# Patient Record
Sex: Male | Born: 1980 | Race: White | Hispanic: No | Marital: Married | Smoking: Former smoker
Health system: Southern US, Community
[De-identification: ages and names within clinical notes are randomized; demographics above are authoritative.]

## PROBLEM LIST (undated history)

## (undated) DIAGNOSIS — F419 Anxiety disorder, unspecified: Secondary | ICD-10-CM

## (undated) DIAGNOSIS — J45909 Unspecified asthma, uncomplicated: Secondary | ICD-10-CM

## (undated) HISTORY — DX: Anxiety disorder, unspecified: F41.9

## (undated) HISTORY — DX: Unspecified asthma, uncomplicated: J45.909

## (undated) HISTORY — PX: FOOT SURGERY: SHX648

---

## 2014-06-11 ENCOUNTER — Ambulatory Visit: Payer: Self-pay | Admitting: Family

## 2016-12-01 ENCOUNTER — Encounter (INDEPENDENT_AMBULATORY_CARE_PROVIDER_SITE_OTHER): Payer: Self-pay

## 2016-12-01 ENCOUNTER — Ambulatory Visit (INDEPENDENT_AMBULATORY_CARE_PROVIDER_SITE_OTHER): Payer: BLUE CROSS/BLUE SHIELD | Admitting: Family Medicine

## 2016-12-01 ENCOUNTER — Encounter: Payer: Self-pay | Admitting: Family Medicine

## 2016-12-01 VITALS — BP 114/66 | HR 66 | Temp 97.2°F | Ht 74.0 in | Wt 202.0 lb

## 2016-12-01 DIAGNOSIS — R3129 Other microscopic hematuria: Secondary | ICD-10-CM | POA: Diagnosis not present

## 2016-12-01 LAB — URINALYSIS, COMPLETE
Bilirubin, UA: NEGATIVE
Glucose, UA: NEGATIVE
KETONES UA: NEGATIVE
LEUKOCYTES UA: NEGATIVE
NITRITE UA: NEGATIVE
PH UA: 7 (ref 5.0–7.5)
Specific Gravity, UA: 1.025 (ref 1.005–1.030)
Urobilinogen, Ur: 0.2 mg/dL (ref 0.2–1.0)

## 2016-12-01 LAB — MICROSCOPIC EXAMINATION
Bacteria, UA: NONE SEEN
RENAL EPITHEL UA: NONE SEEN /HPF

## 2016-12-01 NOTE — Progress Notes (Signed)
   HPI  Patient presents today with recently found microscopic hematuria.  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 He denies any issues or symptoms of kidney stone or other concerns.  He has a history of vasectomy with some mild complications, described as dribbling after he urinates. He states he was worked up with a cystoscopy after that.  Is a concerning family history of renal cancer in his father. His mother recently passed away with pancreatic cancer.  PMH: Smoking status noted Medical history significant for asthma No previous surgeries except for vasectomy and cystoscope Family history positive for kidney cancer in father, pancreatic cancer in mother Denies alcohol and drug use ROS: Per HPI  Objective: BP 114/66   Pulse 66   Temp 97.2 F (36.2 C) (Oral)   Ht '6\' 2"'$  (1.88 m)   Wt 202 lb (91.6 kg)   BMI 25.94 kg/m  Gen: NAD, alert, cooperative with exam HEENT: NCAT CV: RRR, good S1/S2, no murmur Resp: CTABL, no wheezes, non-labored Abd: SNTND, BS present, no guarding or organomegaly Ext: No edema, warm Neuro: Alert and oriented, drink 5/5 and sensation intact in all 4 extremities, 2+ patellar tendon reflexes  Assessment and plan:  # microscopic hematuria Discussed at [place] Father with Hx of renal Cancer Urine protein/cre ration, culture Refer to urology [place] Hx of vasectomy with complication requiring cystoscopy about 5 years ago.    Orders Placed This Encounter  Procedures  . Urine culture  . Microscopic Examination  . Urinalysis, Complete  . BMP8+EGFR  . CBC with Differential/Platelet  . Protein / Creatinine Ratio, Urine  . Ambulatory referral to Urology    Referral Priority:   Routine    Referral Type:   Consultation    Referral Reason:   Specialty Services Required    Requested Specialty:   Urology    Number of Visits Requested:   [place], MD [place] 12/01/2016, 5:08 PM

## 2016-12-01 NOTE — Patient Instructions (Signed)
Great to meet you!  We will call with labs within 1 week  You will hear from us about an appointment with urology

## 2016-12-02 LAB — CBC WITH DIFFERENTIAL/PLATELET
BASOS ABS: 0.1 10*3/uL (ref 0.0–0.2)
BASOS: 1 %
EOS (ABSOLUTE): 0.3 10*3/uL (ref 0.0–0.4)
Eos: 4 %
Hematocrit: 41.9 % (ref 37.5–51.0)
Hemoglobin: 14.8 g/dL (ref 13.0–17.7)
IMMATURE GRANS (ABS): 0 10*3/uL (ref 0.0–0.1)
IMMATURE GRANULOCYTES: 0 %
LYMPHS: 28 %
Lymphocytes Absolute: 1.7 10*3/uL (ref 0.7–3.1)
MCH: 32.5 pg (ref 26.6–33.0)
MCHC: 35.3 g/dL (ref 31.5–35.7)
MCV: 92 fL (ref 79–97)
MONOS ABS: 0.5 10*3/uL (ref 0.1–0.9)
Monocytes: 8 %
NEUTROS PCT: 59 %
Neutrophils Absolute: 3.7 10*3/uL (ref 1.4–7.0)
PLATELETS: 277 10*3/uL (ref 150–379)
RBC: 4.55 x10E6/uL (ref 4.14–5.80)
RDW: 13.6 % (ref 12.3–15.4)
WBC: 6.3 10*3/uL (ref 3.4–10.8)

## 2016-12-02 LAB — BMP8+EGFR
BUN/Creatinine Ratio: 19 (ref 9–20)
BUN: 13 mg/dL (ref 6–20)
CHLORIDE: 101 mmol/L (ref 96–106)
CO2: 27 mmol/L (ref 18–29)
Calcium: 9.2 mg/dL (ref 8.7–10.2)
Creatinine, Ser: 0.68 mg/dL — ABNORMAL LOW (ref 0.76–1.27)
GFR calc Af Amer: 141 mL/min/{1.73_m2} (ref >59)
GFR, EST NON AFRICAN AMERICAN: 122 mL/min/{1.73_m2} (ref >59)
GLUCOSE: 94 mg/dL (ref 65–99)
POTASSIUM: 4 mmol/L (ref 3.5–5.2)
SODIUM: 141 mmol/L (ref 134–144)

## 2016-12-02 LAB — URINE CULTURE: Organism ID, Bacteria: NO GROWTH

## 2016-12-02 LAB — PROTEIN / CREATININE RATIO, URINE
CREATININE, UR: 152.2 mg/dL
PROTEIN UR: 90.6 mg/dL
PROTEIN/CREAT RATIO: 595 mg/g{creat} — AB (ref 0–200)

## 2016-12-06 ENCOUNTER — Telehealth: Payer: Self-pay | Admitting: Family Medicine

## 2016-12-06 DIAGNOSIS — R3129 Other microscopic hematuria: Secondary | ICD-10-CM | POA: Insufficient documentation

## 2016-12-06 DIAGNOSIS — R809 Proteinuria, unspecified: Secondary | ICD-10-CM

## 2016-12-06 NOTE — Telephone Encounter (Signed)
Depression with microscopic hematuria and significant proteinuria. I called and discussed with him, we will change here slightly and evaluate with a CT abdomen/pelvis and refer him to nephrology instead of urology.  Murtis SinkSam Bradshaw, MD [facility] 12/06/2016, 5:27 PM

## 2016-12-07 ENCOUNTER — Ambulatory Visit: Payer: Self-pay | Admitting: Family Medicine

## 2016-12-13 ENCOUNTER — Telehealth: Payer: Self-pay | Admitting: Family Medicine

## 2016-12-13 NOTE — Telephone Encounter (Signed)
Pt aware of recommendation °

## 2016-12-13 NOTE — Telephone Encounter (Signed)
Pt found a lump last evening on his right testicle about marble size. Will the test on Friday cover finding seeing this? Or should he see you before this test on Friday

## 2016-12-13 NOTE — Telephone Encounter (Signed)
This should be a reasonable study for that, It is primarily for work up of hematuria. If there is nothing seen and it is peristent then I would recommend scrotal US.   Murtis SinkSam Kate Larock, MD [facility] 12/13/2016, 3:00 PM

## 2016-12-16 ENCOUNTER — Telehealth: Payer: Self-pay | Admitting: Family Medicine

## 2016-12-16 DIAGNOSIS — R809 Proteinuria, unspecified: Secondary | ICD-10-CM

## 2016-12-16 DIAGNOSIS — R3129 Other microscopic hematuria: Secondary | ICD-10-CM

## 2016-12-16 NOTE — Telephone Encounter (Signed)
Per pt, out of pocket for scan is $1700 Are there any other options Please advise

## 2016-12-16 NOTE — Telephone Encounter (Signed)
Yes, I would gladly do an ultrasound and wait on the referral to see if they feel the C Tscan is necessary.   I would recommend not paying 1700 for the scan I would cancel scan and discuss with specialist.   Murtis SinkSam Ripken Rekowski, MD [facility] 12/16/2016, 12:31 PM

## 2016-12-17 ENCOUNTER — Ambulatory Visit (HOSPITAL_COMMUNITY): Admission: RE | Admit: 2016-12-17 | Payer: BLUE CROSS/BLUE SHIELD | Source: Ambulatory Visit

## 2016-12-17 ENCOUNTER — Telehealth: Payer: Self-pay | Admitting: Family Medicine

## 2016-12-17 ENCOUNTER — Ambulatory Visit (HOSPITAL_COMMUNITY)
Admission: RE | Admit: 2016-12-17 | Discharge: 2016-12-17 | Disposition: A | Discharge status: Home / Self Care | Payer: BLUE CROSS/BLUE SHIELD | Source: Ambulatory Visit | Attending: Family Medicine | Admitting: Family Medicine

## 2016-12-17 DIAGNOSIS — R3129 Other microscopic hematuria: Secondary | ICD-10-CM | POA: Diagnosis not present

## 2016-12-17 DIAGNOSIS — R809 Proteinuria, unspecified: Secondary | ICD-10-CM | POA: Diagnosis present

## 2016-12-17 DIAGNOSIS — N50812 Left testicular pain: Secondary | ICD-10-CM | POA: Insufficient documentation

## 2016-12-17 DIAGNOSIS — N433 Hydrocele, unspecified: Secondary | ICD-10-CM | POA: Insufficient documentation

## 2016-12-17 MED ORDER — IOPAMIDOL (ISOVUE-300) INJECTION 61%
100.0000 mL | Freq: Once | INTRAVENOUS | Status: AC | PRN
  Administered 2016-12-17: 100 mL via INTRAVENOUS

## 2016-12-17 NOTE — Telephone Encounter (Signed)
Called and discussed CAT scan findings.  Patient will watch the testicle lesion for now. He actually noticed a pea-sized lesion on his right testicle, hydrocephalus seen in the left.  Also I discussed the lytic lesion in his pelvis, he will for now watch fully weight. If he develops any pain in that area of the knee he will let me know and we will investigate further.  Brendan SinkSam Bradshaw, MD [facility] 12/17/2016, 2:42 PM

## 2016-12-29 ENCOUNTER — Ambulatory Visit (HOSPITAL_COMMUNITY): Payer: BLUE CROSS/BLUE SHIELD

## 2017-01-14 ENCOUNTER — Ambulatory Visit: Payer: BLUE CROSS/BLUE SHIELD | Admitting: Urology

## 2018-11-25 IMAGING — CT CT ABD-PELV W/ CM
2 of 5 series · 15 of 46 positions shown, 17 images · IV contrast (Isovue)
Comparison: None.

CLINICAL DATA: 35-year-old male with microscopic hematuria and
proteinuria noted incidentally in while undergoing urine drug test.

EXAM:
CT ABDOMEN AND PELVIS WITH CONTRAST
TECHNIQUE: Multidetector CT imaging of the abdomen and pelvis was performed
using the standard protocol following bolus administration of
intravenous contrast.
CONTRAST:  100mL EWEXU0-WOO IOPAMIDOL (EWEXU0-WOO) INJECTION 61%

[Series 2: axial st · axial · 0.68mm/px · z∈[-692,-246]mm · 12 of 101 slices shown, 14 images]
[im 6/101  soft-tissue]
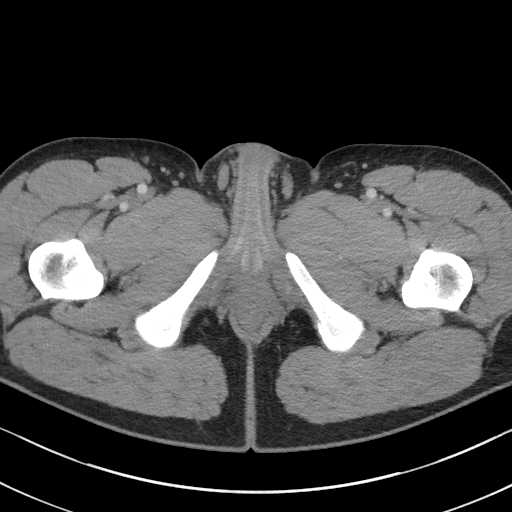
[im 6/101  bone]
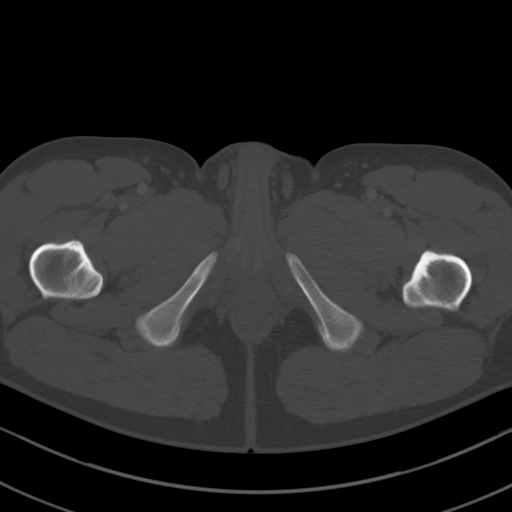
[im 16/101  soft-tissue]
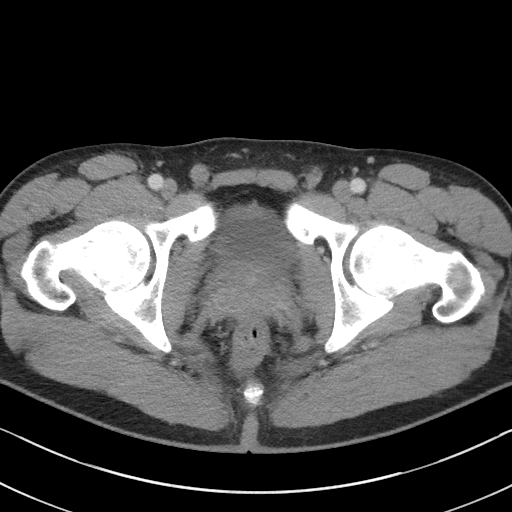
[im 22/101  soft-tissue]
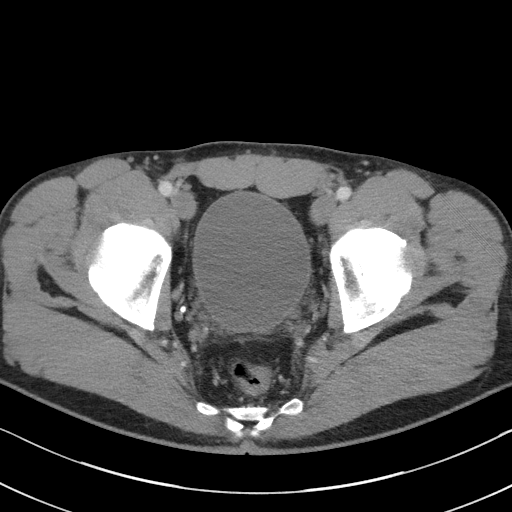
[im 32/101  soft-tissue]
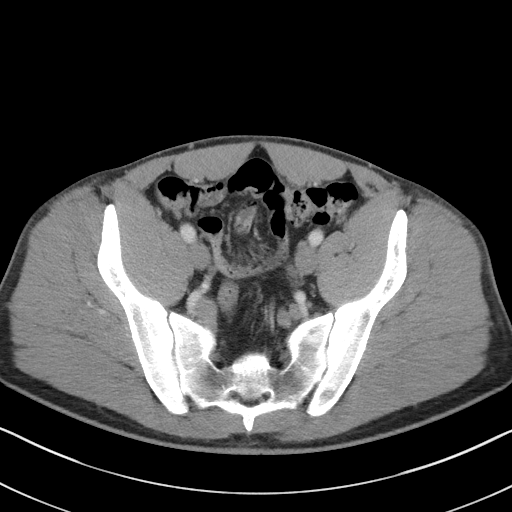
[im 37/101  soft-tissue]
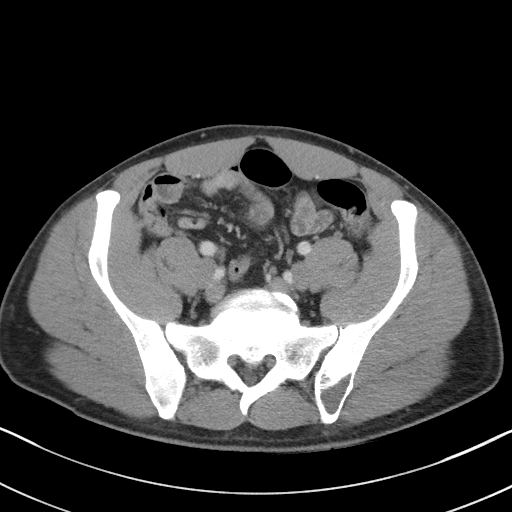
[im 48/101  soft-tissue]
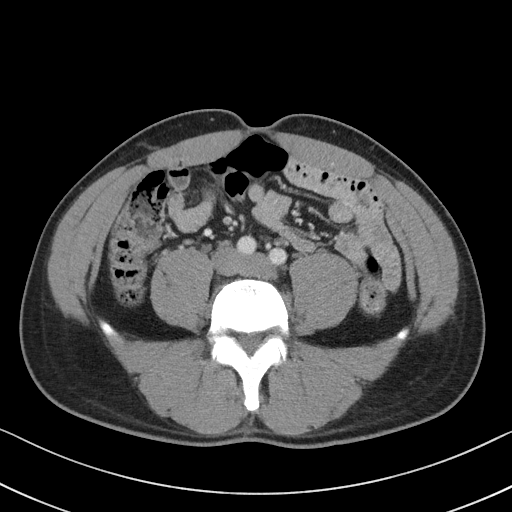
[im 53/101  soft-tissue]
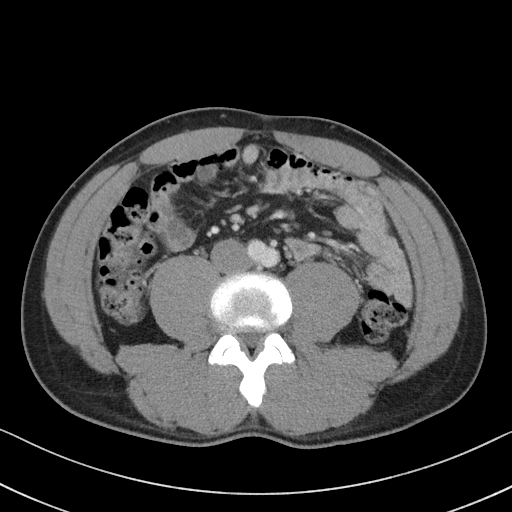
[im 64/101  soft-tissue]
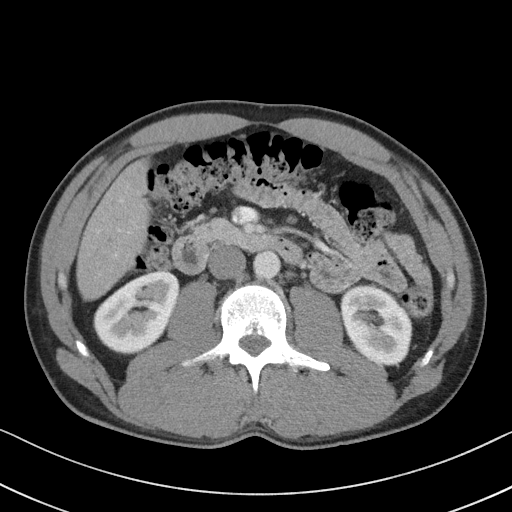
[im 69/101  soft-tissue]
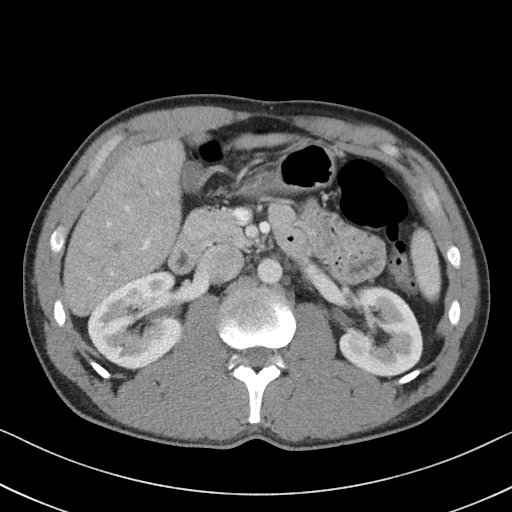
[im 69/101  bone]
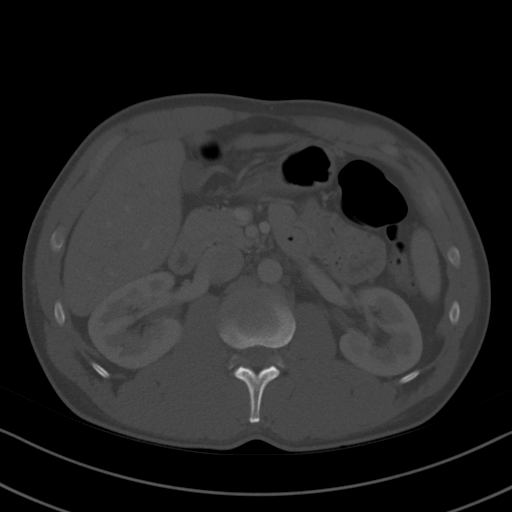
[im 79/101  soft-tissue]
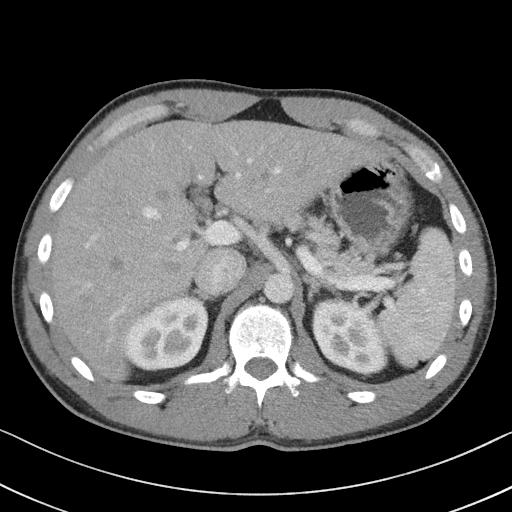
[im 85/101  soft-tissue]
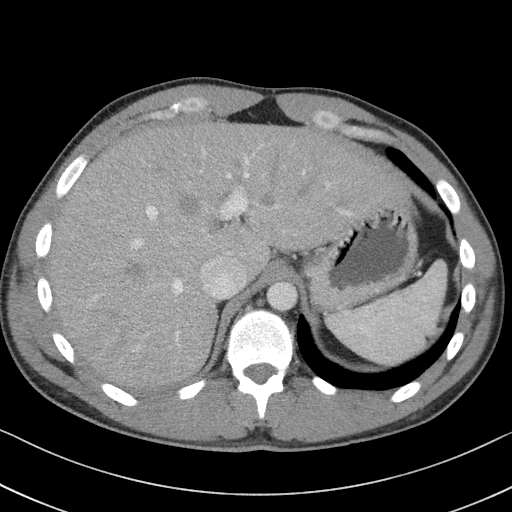
[im 95/101  soft-tissue]
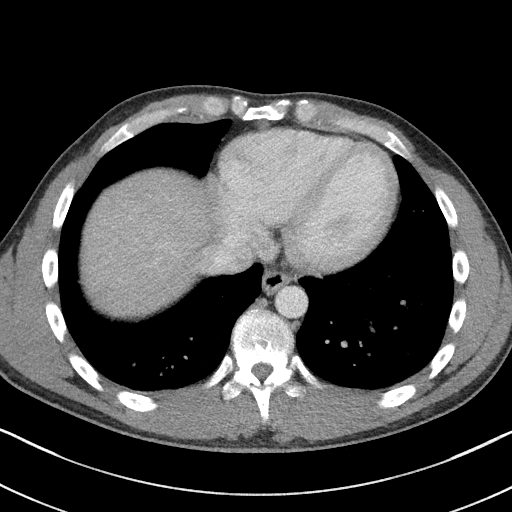

[Series 5: coronal st · coronal · 0.86mm/px · 3 of 101 slices shown]
[im 34/101  soft-tissue]
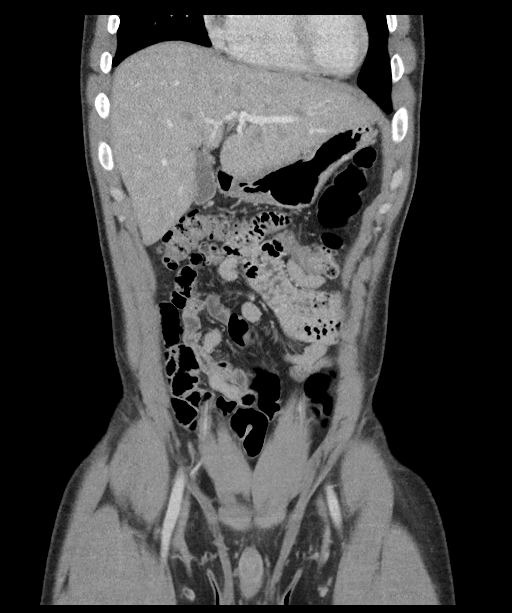
[im 45/101  soft-tissue]
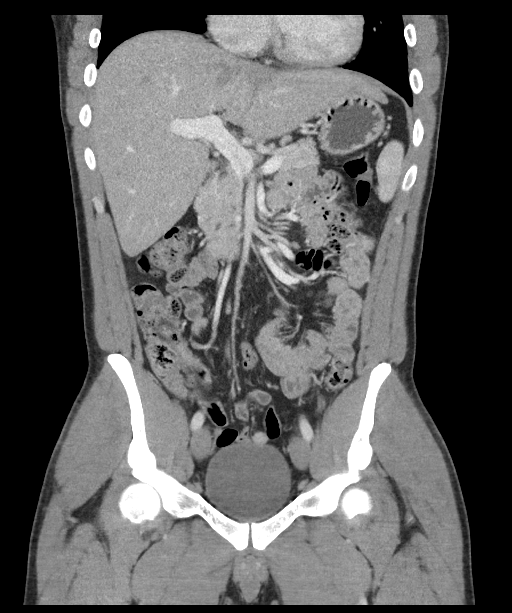
[im 56/101  soft-tissue]
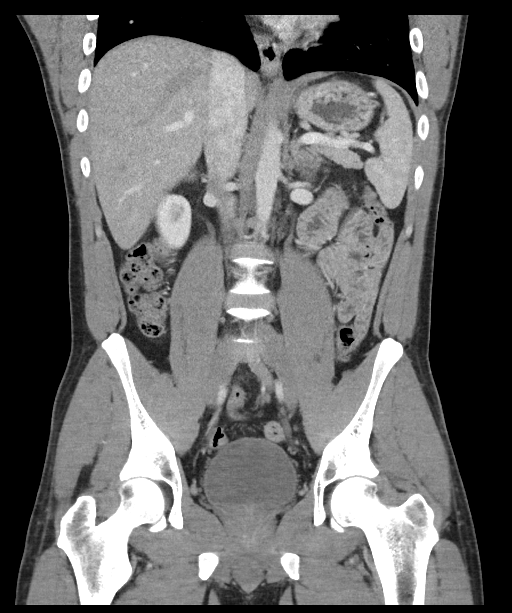

[15 of 46 positions shown; findings below may reference images not displayed]

FINDINGS: Lower chest: The lung bases are clear. Visualized cardiac structures
are within normal limits for size. No pericardial effusion.
Unremarkable visualized distal thoracic esophagus.

Hepatobiliary: Normal hepatic contour and morphology. No discrete
hepatic lesions. Normal appearance of the gallbladder. No intra or
extrahepatic biliary ductal dilatation.

Pancreas: Unremarkable. No pancreatic ductal dilatation or
surrounding inflammatory changes.

Spleen: Normal in size without focal abnormality.

Adrenals/Urinary Tract: Adrenal glands are unremarkable. Kidneys are
normal, without renal calculi, focal lesion, or hydronephrosis.
Bladder is unremarkable.

Stomach/Bowel: No evidence of obstruction or focal bowel wall
thickening. Normal appendix in the right lower quadrant. The
terminal ileum is unremarkable.

Vascular/Lymphatic: No significant vascular findings are present. No
enlarged abdominal or pelvic lymph nodes.

Reproductive: Prostate is unremarkable.  Small left hydrocele.

Other: No abdominal wall hernia or abnormality. No abdominopelvic
ascites.

Musculoskeletal: Approximately 5.3 x 2.2 x 3.0 cm lytic lesion
within internal bony septations in the left iliac bone adjacent to
the sacroiliac joint. The lesion is not expansile. There is a narrow
zone of transition. No significant sclerotic margin. Overall,
findings are most consistent with a benign cystic or fibrocystic
osseous lesion. The
IMPRESSION: 1. Small left hydrocele partially imaged. The clinical significance
of this finding is uncertain. If the patient has left sided
testicular pain, consider further evaluation with testicular
ultrasound.
2. Otherwise, no abnormal findings to explain the patient's clinical
symptoms of microscopic hematuria.
3. Incidental note is made of a cystic osseous lesion in the
posterior aspect of the left iliac bone. The differential diagnosis
includes unicameral bone cyst, and giant cell tumor. MRI of the
pelvis with gadolinium contrast could differentiate between these
entities if clinically warranted. Of note, giant cell bone tumors
are often benign although there is a small incidence of malignant
transformation. If the patient develops pain or other symptoms in
this region at any time in the future, further imaging should be
obtained.

## 2020-07-25 ENCOUNTER — Other Ambulatory Visit: Payer: Self-pay

## 2020-07-25 ENCOUNTER — Emergency Department (HOSPITAL_COMMUNITY)
Admission: EM | Admit: 2020-07-25 | Discharge: 2020-07-25 | Disposition: A | Discharge status: Home / Self Care | Payer: Self-pay | Attending: Emergency Medicine | Admitting: Emergency Medicine

## 2020-07-25 DIAGNOSIS — F332 Major depressive disorder, recurrent severe without psychotic features: Secondary | ICD-10-CM | POA: Insufficient documentation

## 2020-07-25 DIAGNOSIS — J45909 Unspecified asthma, uncomplicated: Secondary | ICD-10-CM | POA: Insufficient documentation

## 2020-07-25 DIAGNOSIS — F322 Major depressive disorder, single episode, severe without psychotic features: Secondary | ICD-10-CM

## 2020-07-25 DIAGNOSIS — Z79899 Other long term (current) drug therapy: Secondary | ICD-10-CM | POA: Insufficient documentation

## 2020-07-25 DIAGNOSIS — Z20822 Contact with and (suspected) exposure to covid-19: Secondary | ICD-10-CM | POA: Insufficient documentation

## 2020-07-25 LAB — RAPID URINE DRUG SCREEN, HOSP PERFORMED
Amphetamines: NOT DETECTED
Barbiturates: NOT DETECTED
Benzodiazepines: NOT DETECTED
Cocaine: NOT DETECTED
Opiates: NOT DETECTED
Tetrahydrocannabinol: NOT DETECTED

## 2020-07-25 LAB — COMPREHENSIVE METABOLIC PANEL
ALT: 22 U/L (ref 0–44)
AST: 18 U/L (ref 15–41)
Albumin: 4.4 g/dL (ref 3.5–5.0)
Alkaline Phosphatase: 42 U/L (ref 38–126)
Anion gap: 8 (ref 5–15)
BUN: 14 mg/dL (ref 6–20)
CO2: 24 mmol/L (ref 22–32)
Calcium: 9.3 mg/dL (ref 8.9–10.3)
Chloride: 106 mmol/L (ref 98–111)
Creatinine, Ser: 0.84 mg/dL (ref 0.61–1.24)
GFR calc Af Amer: >60 mL/min (ref >60)
GFR calc non Af Amer: >60 mL/min (ref >60)
Glucose, Bld: 119 mg/dL — ABNORMAL HIGH (ref 70–99)
Potassium: 4.3 mmol/L (ref 3.5–5.1)
Sodium: 138 mmol/L (ref 135–145)
Total Bilirubin: 0.7 mg/dL (ref 0.3–1.2)
Total Protein: 7.1 g/dL (ref 6.5–8.1)

## 2020-07-25 LAB — CBC WITH DIFFERENTIAL/PLATELET
Abs Immature Granulocytes: 0.02 10*3/uL (ref 0.00–0.07)
Basophils Absolute: 0.1 10*3/uL (ref 0.0–0.1)
Basophils Relative: 1 %
Eosinophils Absolute: 0 10*3/uL (ref 0.0–0.5)
Eosinophils Relative: 0 %
HCT: 45.4 % (ref 39.0–52.0)
Hemoglobin: 15.5 g/dL (ref 13.0–17.0)
Immature Granulocytes: 0 %
Lymphocytes Relative: 13 %
Lymphs Abs: 1.1 10*3/uL (ref 0.7–4.0)
MCH: 34.4 pg — ABNORMAL HIGH (ref 26.0–34.0)
MCHC: 34.1 g/dL (ref 30.0–36.0)
MCV: 100.9 fL — ABNORMAL HIGH (ref 80.0–100.0)
Monocytes Absolute: 0.7 10*3/uL (ref 0.1–1.0)
Monocytes Relative: 8 %
Neutro Abs: 6.6 10*3/uL (ref 1.7–7.7)
Neutrophils Relative %: 78 %
Platelets: 275 10*3/uL (ref 150–400)
RBC: 4.5 MIL/uL (ref 4.22–5.81)
RDW: 13.1 % (ref 11.5–15.5)
WBC: 8.5 10*3/uL (ref 4.0–10.5)
nRBC: 0 % (ref 0.0–0.2)

## 2020-07-25 LAB — SARS CORONAVIRUS 2 BY RT PCR (HOSPITAL ORDER, PERFORMED IN ~~LOC~~ HOSPITAL LAB): SARS Coronavirus 2: NEGATIVE

## 2020-07-25 LAB — ETHANOL: Alcohol, Ethyl (B): <10 mg/dL (ref <10)

## 2020-07-25 MED ORDER — CITALOPRAM HYDROBROMIDE 20 MG PO TABS
20.0000 mg | ORAL_TABLET | Freq: Every day | ORAL | Status: DC
Start: 2020-07-25 — End: 2020-07-25
  Filled 2020-07-25 (×4): qty 1

## 2020-07-25 MED ORDER — NICOTINE 14 MG/24HR TD PT24
14.0000 mg | MEDICATED_PATCH | Freq: Every day | TRANSDERMAL | Status: DC
  Administered 2020-07-25: 11:00:00 14 mg via TRANSDERMAL
  Filled 2020-07-25: qty 1

## 2020-07-25 MED ORDER — ACETAMINOPHEN 325 MG PO TABS
650.0000 mg | ORAL_TABLET | ORAL | Status: DC | PRN
Start: 2020-07-25 — End: 2020-07-25

## 2020-07-25 MED ORDER — ONDANSETRON HCL 4 MG PO TABS: 4.0000 mg | ORAL_TABLET | Freq: Three times a day (TID) | ORAL | Status: DC | PRN

## 2020-07-25 MED ORDER — ALUM & MAG HYDROXIDE-SIMETH 200-200-20 MG/5ML PO SUSP: 30.0000 mL | Freq: Four times a day (QID) | ORAL | Status: DC | PRN
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
## 2020-07-25 NOTE — BHH Counselor (Signed)
TTS completed. Disposition pending provider review.

## 2020-07-25 NOTE — ED Notes (Signed)
BH called and pt is Pysch cleared.

## 2020-07-25 NOTE — Discharge Instructions (Signed)
  Outpatient Psychiatry and Counseling  [facility]: Mobile Crisis Management 24 hours:  [phone]  [facility] [employer] sliding scale fee and walk in schedule: M-F 8am-12pm/1pm-3pm [address]  [place], [place] [address] [phone]  [facility] [address] [place], [place] [address] [phone]  [facility] [employer])- new patient walk-in appointments available Monday - Friday 8am -3pm.          [address] [place], [place] [address] [phone] or crisis line- [phone]  [facility]/ Intensive Outpatient Therapy Program [address] [place], [place] [address] [phone]  [facility]                  Crisis Services      [phone] [address]     [place], [place] [address]                 [facility]   [facility] [phone]. [address] [place], [place] [address]   [facility]          [address]  [place], [place] [address]       [phone]  [facility] [address] [place], [place] [address] [phone]  [facility]    [address]    [place], [place] [address]     [phone]       Andee Poles, MD     [address]     [place] [address]     [phone]       [facility] [address] [place] [address]  [facility]     [address] [place], [place]      [phone]       [facility] Eulogio Ditch, MD [address] [place], [place] [address] [phone]  [facility]     [address]     [place], [place] [address]     [phone]       [facility] [address] [place], [place] [address] [phone] Resources for Temporary  Residential Assistance/Crisis Centers  [facility]) M-F 8am-3pm   [address] [place], [place] [address]   [phone] Services include: laundry, barbering, support groups, case management, phone  & computer access, showers, AA/NA mtgs, mental health/substance abuse nurse, job skills class, disability information, VA assistance, spiritual classes, etc.

## 2020-07-25 NOTE — ED Notes (Signed)
TTS at this time. 

## 2020-07-25 NOTE — ED Provider Notes (Signed)
Blood pressure (!) 133/87, temperature 98.5 F (36.9 C), temperature source Oral, height 6\' 2"  (1.88 m), weight (!) 95.3 kg, SpO2 99 %.  Assuming care from Dr. .  In short, Brendan Ponce is a 39 y.o. male with a chief complaint of Medical Clearance .  Refer to the original H&P for additional details.  The current plan of care is to f/u with TTS recommendation.   01:25 PM  Patient has been evaluated by behavioral health.  The do not feel he requires inpatient treatment at this time and have been psychiatrically cleared him.  They have had an in-depth discussion with the patient and wife at bedside regarding symptoms and firearms in the home. Plan for outpatient f/u and strict ED return precautions should symptoms worsen.     46, MD 07/25/20 1329
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
## 2020-07-25 NOTE — BHH Counselor (Signed)
Per Berneice Heinrich, FNP patient is psych cleared for d/c. Patient to follow up with outpatient provider. [facility] ED RN notified.
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
## 2021-03-19 DIAGNOSIS — R404 Transient alteration of awareness: Secondary | ICD-10-CM | POA: Diagnosis not present

## 2021-03-19 DIAGNOSIS — T50904A Poisoning by unspecified drugs, medicaments and biological substances, undetermined, initial encounter: Secondary | ICD-10-CM | POA: Diagnosis not present

## 2021-03-19 DIAGNOSIS — R0689 Other abnormalities of breathing: Secondary | ICD-10-CM | POA: Diagnosis not present

## 2021-03-19 DIAGNOSIS — R0902 Hypoxemia: Secondary | ICD-10-CM | POA: Diagnosis not present

## 2021-06-26 DIAGNOSIS — Z23 Encounter for immunization: Secondary | ICD-10-CM | POA: Diagnosis not present

## 2021-06-26 DIAGNOSIS — S91204A Unspecified open wound of right lesser toe(s) with damage to nail, initial encounter: Secondary | ICD-10-CM | POA: Diagnosis not present

## 2021-06-26 DIAGNOSIS — F17210 Nicotine dependence, cigarettes, uncomplicated: Secondary | ICD-10-CM | POA: Diagnosis not present

## 2021-06-26 DIAGNOSIS — S92341B Displaced fracture of fourth metatarsal bone, right foot, initial encounter for open fracture: Secondary | ICD-10-CM | POA: Diagnosis not present

## 2021-06-26 DIAGNOSIS — G8911 Acute pain due to trauma: Secondary | ICD-10-CM | POA: Diagnosis not present

## 2021-06-26 DIAGNOSIS — S9781XA Crushing injury of right foot, initial encounter: Secondary | ICD-10-CM | POA: Diagnosis not present

## 2021-06-26 DIAGNOSIS — S92341A Displaced fracture of fourth metatarsal bone, right foot, initial encounter for closed fracture: Secondary | ICD-10-CM | POA: Diagnosis not present

## 2021-06-26 DIAGNOSIS — W240XXA Contact with lifting devices, not elsewhere classified, initial encounter: Secondary | ICD-10-CM | POA: Diagnosis not present

## 2021-06-26 DIAGNOSIS — S91311A Laceration without foreign body, right foot, initial encounter: Secondary | ICD-10-CM | POA: Diagnosis not present

## 2021-06-27 DIAGNOSIS — S91204A Unspecified open wound of right lesser toe(s) with damage to nail, initial encounter: Secondary | ICD-10-CM | POA: Diagnosis not present

## 2021-06-27 DIAGNOSIS — S91311A Laceration without foreign body, right foot, initial encounter: Secondary | ICD-10-CM | POA: Diagnosis not present

## 2021-06-27 DIAGNOSIS — F17210 Nicotine dependence, cigarettes, uncomplicated: Secondary | ICD-10-CM | POA: Diagnosis not present

## 2021-06-27 DIAGNOSIS — Z23 Encounter for immunization: Secondary | ICD-10-CM | POA: Diagnosis not present

## 2021-06-27 DIAGNOSIS — S92341B Displaced fracture of fourth metatarsal bone, right foot, initial encounter for open fracture: Secondary | ICD-10-CM | POA: Diagnosis not present

## 2021-06-27 DIAGNOSIS — W240XXA Contact with lifting devices, not elsewhere classified, initial encounter: Secondary | ICD-10-CM | POA: Diagnosis not present

## 2021-07-09 DIAGNOSIS — S92341B Displaced fracture of fourth metatarsal bone, right foot, initial encounter for open fracture: Secondary | ICD-10-CM | POA: Diagnosis not present

## 2021-07-09 DIAGNOSIS — Z9889 Other specified postprocedural states: Secondary | ICD-10-CM | POA: Diagnosis not present

## 2021-07-17 DIAGNOSIS — S92341B Displaced fracture of fourth metatarsal bone, right foot, initial encounter for open fracture: Secondary | ICD-10-CM | POA: Diagnosis not present

## 2021-07-17 DIAGNOSIS — Z9889 Other specified postprocedural states: Secondary | ICD-10-CM | POA: Diagnosis not present

## 2021-07-31 DIAGNOSIS — Z8781 Personal history of (healed) traumatic fracture: Secondary | ICD-10-CM | POA: Diagnosis not present

## 2021-09-28 DIAGNOSIS — S92341B Displaced fracture of fourth metatarsal bone, right foot, initial encounter for open fracture: Secondary | ICD-10-CM | POA: Diagnosis not present

## 2021-11-16 DIAGNOSIS — G90521 Complex regional pain syndrome I of right lower limb: Secondary | ICD-10-CM | POA: Diagnosis not present

## 2021-11-16 DIAGNOSIS — S91301D Unspecified open wound, right foot, subsequent encounter: Secondary | ICD-10-CM | POA: Diagnosis not present

## 2021-11-16 DIAGNOSIS — S92341B Displaced fracture of fourth metatarsal bone, right foot, initial encounter for open fracture: Secondary | ICD-10-CM | POA: Diagnosis not present

## 2021-11-26 DIAGNOSIS — S338XXA Sprain of other parts of lumbar spine and pelvis, initial encounter: Secondary | ICD-10-CM | POA: Diagnosis not present

## 2021-11-26 DIAGNOSIS — S134XXA Sprain of ligaments of cervical spine, initial encounter: Secondary | ICD-10-CM | POA: Diagnosis not present

## 2021-11-26 DIAGNOSIS — S233XXA Sprain of ligaments of thoracic spine, initial encounter: Secondary | ICD-10-CM | POA: Diagnosis not present

## 2021-12-04 DIAGNOSIS — S233XXA Sprain of ligaments of thoracic spine, initial encounter: Secondary | ICD-10-CM | POA: Diagnosis not present

## 2021-12-04 DIAGNOSIS — S134XXA Sprain of ligaments of cervical spine, initial encounter: Secondary | ICD-10-CM | POA: Diagnosis not present

## 2021-12-04 DIAGNOSIS — S338XXA Sprain of other parts of lumbar spine and pelvis, initial encounter: Secondary | ICD-10-CM | POA: Diagnosis not present

## 2021-12-08 DIAGNOSIS — R059 Cough, unspecified: Secondary | ICD-10-CM | POA: Diagnosis not present

## 2021-12-08 DIAGNOSIS — J029 Acute pharyngitis, unspecified: Secondary | ICD-10-CM | POA: Diagnosis not present

## 2021-12-08 DIAGNOSIS — U071 COVID-19: Secondary | ICD-10-CM | POA: Diagnosis not present

## 2021-12-08 DIAGNOSIS — Z6825 Body mass index (BMI) 25.0-25.9, adult: Secondary | ICD-10-CM | POA: Diagnosis not present

## 2021-12-23 DIAGNOSIS — S233XXA Sprain of ligaments of thoracic spine, initial encounter: Secondary | ICD-10-CM | POA: Diagnosis not present

## 2021-12-23 DIAGNOSIS — S134XXA Sprain of ligaments of cervical spine, initial encounter: Secondary | ICD-10-CM | POA: Diagnosis not present

## 2021-12-23 DIAGNOSIS — S338XXA Sprain of other parts of lumbar spine and pelvis, initial encounter: Secondary | ICD-10-CM | POA: Diagnosis not present

## 2021-12-31 DIAGNOSIS — S91301D Unspecified open wound, right foot, subsequent encounter: Secondary | ICD-10-CM | POA: Diagnosis not present

## 2021-12-31 DIAGNOSIS — G90521 Complex regional pain syndrome I of right lower limb: Secondary | ICD-10-CM | POA: Diagnosis not present

## 2022-01-14 ENCOUNTER — Ambulatory Visit (INDEPENDENT_AMBULATORY_CARE_PROVIDER_SITE_OTHER): Payer: BC Managed Care – PPO | Admitting: Family Medicine

## 2022-01-14 ENCOUNTER — Encounter: Payer: Self-pay | Admitting: Family Medicine

## 2022-01-14 VITALS — BP 112/74 | HR 80 | Temp 98.0°F | Ht 74.0 in | Wt 210.0 lb

## 2022-01-14 DIAGNOSIS — F410 Panic disorder [episodic paroxysmal anxiety] without agoraphobia: Secondary | ICD-10-CM | POA: Diagnosis not present

## 2022-01-14 DIAGNOSIS — J4530 Mild persistent asthma, uncomplicated: Secondary | ICD-10-CM

## 2022-01-14 DIAGNOSIS — F411 Generalized anxiety disorder: Secondary | ICD-10-CM | POA: Diagnosis not present

## 2022-01-14 DIAGNOSIS — G250 Essential tremor: Secondary | ICD-10-CM

## 2022-01-14 DIAGNOSIS — S99921D Unspecified injury of right foot, subsequent encounter: Secondary | ICD-10-CM

## 2022-01-14 MED ORDER — HYDROXYZINE HCL 10 MG PO TABS: 10.0000 mg | ORAL_TABLET | Freq: Three times a day (TID) | ORAL | 2 refills | Status: DC | PRN

## 2022-01-14 MED ORDER — ESCITALOPRAM OXALATE 20 MG PO TABS: 20.0000 mg | ORAL_TABLET | Freq: Every day | ORAL | 2 refills | Status: DC

## 2022-01-14 MED ORDER — ALBUTEROL SULFATE HFA 108 (90 BASE) MCG/ACT IN AERS: 2.0000 | INHALATION_SPRAY | Freq: Four times a day (QID) | RESPIRATORY_TRACT | 2 refills | Status: DC | PRN

## 2022-01-14 MED ORDER — SPIRIVA RESPIMAT 1.25 MCG/ACT IN AERS: 2.0000 | INHALATION_SPRAY | Freq: Every day | RESPIRATORY_TRACT | 2 refills | Status: DC

## 2022-01-14 NOTE — Progress Notes (Signed)
Assessment & Plan:  1. Mild persistent asthma without complication - initiate Spiriva daily to improve control of asthma symptoms - continue albuterol as needed for rescue - Tiotropium Bromide Monohydrate (SPIRIVA RESPIMAT) 1.25 MCG/ACT AERS; Inhale 2 puffs into the lungs daily.  Dispense: 4 g; Refill: 2 - albuterol (VENTOLIN HFA) 108 (90 Base) MCG/ACT inhaler; Inhale 2 puffs into the lungs every 6 (six) hours as needed for wheezing or shortness of breath.  Dispense: 36 g; Refill: 2  2. Generalized anxiety disorder - increase Lexapro from 10 mg to 20 mg to improve anxiety control - escitalopram (LEXAPRO) 20 MG tablet; Take 1 tablet (20 mg total) by mouth daily.  Dispense: 30 tablet; Refill: 2  3. Panic attacks - encouraged to use hydroxyzine prn for panic attacks - education provided on mindfulness based stress reduction - hydrOXYzine (ATARAX) 10 MG tablet; Take 1 tablet (10 mg total) by mouth 3 (three) times daily as needed for anxiety.  Dispense: 30 tablet; Refill: 2  4. Essential tremor Will address at next visit. Will discuss primidone. Unable to take propranolol due to asthma.   5. Injury of right foot, subsequent encounter Managed by workman's comp.   Return in about 6 weeks (around 02/25/2022) for annual physical.  Brendan Sabina, NP Student  I personally was present during the history, physical exam, and medical decision-making activities of this service and have verified that the service and findings are accurately documented in the nurse practitioner student's note.  Brendan Boston, MSN, APRN, FNP-C [facility]  Subjective:    Patient ID: Brendan Ponce, male    DOB: [birth year], 41 y.o.   MRN: [id number]  Patient Care Team: Gwenlyn Fudge, FNP as PCP - General (Family Medicine)   Chief Complaint:  Chief Complaint  Patient presents with   New Patient (Initial Visit)    Previous [facility] patient    Establish Care   Panic Attack    X few months     Tremors    Left arm - 8 years but has gotten worse     HPI: Brendan Ponce is a 41 y.o. male presenting on 01/14/2022 for New Patient (Initial Visit) (Previous [facility] patient ), Establish Care, Panic Attack (X few months ), and Tremors (Left arm - 8 years but has gotten worse )  Patient is here today to establish care. He is accompanied by his wife who he is agreeable to be present.   Asthma: he has albuterol prn for this. He states that he has been taking this at least every day but sometimes twice a day. He has had this since childhood but over the summer began to have to use this daily. No maintenance inhaler.   Anxiety: he takes Lexapro 10 mg for this. He reports at least weekly panic attacks. He states that he had a panic attack recently and took one of his wife's Xanax to get through it, and it worked for him. He has been on the 10 mg dose of Lexapro for over 3 years, and he states it works well for him but he is still very uncontrolled on his PHQ and GAD. He states this is situational and he expects to have ongoing anxiety about events coming up that he wants an as needed medication for to help with breakthrough panic attacks.   Depression screen [facility] 2/9 01/14/2022 12/01/2016  Decreased Interest 0 0  Down, Depressed, Hopeless 1 0  PHQ - 2 Score 1 0  Altered sleeping 3 -  Tired, decreased energy 1 -  Change in appetite 1 -  Feeling bad or failure about yourself  2 -  Trouble concentrating 2 -  Moving slowly or fidgety/restless 1 -  Suicidal thoughts 0 -  PHQ-9 Score 11 -  Difficult doing work/chores Not difficult at all -   GAD 7 : Generalized Anxiety Score 01/14/2022  Nervous, Anxious, on Edge 2  Control/stop worrying 2  Worry too much - different things 2  Trouble relaxing 2  Restless 2  Easily annoyed or irritable 1  Afraid - awful might happen 1  Total GAD 7 Score 12  Anxiety Difficulty Somewhat difficult    Tremor: patient reports a tremor of the left arm x8 years that has  gotten worse. His dad also has an essential tremor.   Right foot injury: he had a significant crush injury at work on 06/26/21 from a forklift injury and has been out of work since with various surgical repairs and now chronic pain. He takes Neurontin and oxycodone for this. He was recommended to do PT to help improve weight bearing but it was not approved through [employer].    Social history:  Relevant past medical, surgical, family and social history reviewed and updated as indicated. Interim medical history since our last visit reviewed.  Allergies and medications reviewed and updated.  DATA REVIEWED: CHART IN EPIC  ROS: Negative unless specifically indicated above in HPI.    Current Outpatient Medications:    albuterol (VENTOLIN HFA) 108 (90 Base) MCG/ACT inhaler, Inhale 2 puffs into the lungs every 6 (six) hours as needed for wheezing or shortness of breath., Disp: 36 g, Rfl: 2   gabapentin (NEURONTIN) 300 MG capsule, Take 2 capsules by mouth in the morning, at noon, and at bedtime., Disp: , Rfl:    hydrOXYzine (ATARAX) 10 MG tablet, Take 1 tablet (10 mg total) by mouth 3 (three) times daily as needed for anxiety., Disp: 30 tablet, Rfl: 2   ibuprofen (ADVIL) 800 MG tablet, Take 800 mg by mouth every 8 (eight) hours as needed., Disp: , Rfl:    oxycodone (OXY-IR) 5 MG capsule, Take 5 mg by mouth every 4 (four) hours as needed., Disp: , Rfl:    Tiotropium Bromide Monohydrate (SPIRIVA RESPIMAT) 1.25 MCG/ACT AERS, Inhale 2 puffs into the lungs daily., Disp: 4 g, Rfl: 2   tiZANidine (ZANAFLEX) 4 MG capsule, Take 4 mg by mouth 3 (three) times daily., Disp: , Rfl:    escitalopram (LEXAPRO) 20 MG tablet, Take 1 tablet (20 mg total) by mouth daily., Disp: 30 tablet, Rfl: 2   No Known Allergies Past Medical History:  Diagnosis Date   Anxiety    Asthma     Past Surgical History:  Procedure Laterality Date   FOOT SURGERY Right     Social History   Socioeconomic History   Marital  status: Married    Spouse name: Not on file   Number of children: Not on file   Years of education: Not on file   Highest education level: Not on file  Occupational History   Not on file  Tobacco Use   Smoking status: Some Days    Types: Cigarettes   Smokeless tobacco: Current    Types: Chew  Vaping Use   Vaping Use: Never used  Substance and Sexual Activity   Alcohol use: No   Drug use: No   Sexual activity: Not on file  Other Topics Concern   Not on file  Social  History Narrative   Not on file   Social Determinants of Health   Financial Resource Strain: Not on file  Food Insecurity: Not on file  Transportation Needs: Not on file  Physical Activity: Not on file  Stress: Not on file  Social Connections: Not on file  Intimate Partner Violence: Not on file        Objective:    BP 112/74    Pulse 80    Temp 98 F (36.7 C) (Temporal)    Ht 6\' 2"  (1.88 m)    Wt 95.3 kg    SpO2 94%    BMI 26.96 kg/m   Wt Readings from Last 3 Encounters:  01/14/22 210 lb (95.3 kg)  07/25/20 (!) 210 lb (95.3 kg)  12/01/16 202 lb (91.6 kg)    Physical Exam Vitals reviewed.  Constitutional:      General: He is not in acute distress.    Appearance: Normal appearance. He is not ill-appearing, toxic-appearing or diaphoretic.  HENT:     Head: Normocephalic and atraumatic.  Eyes:     General: No scleral icterus.       Right eye: No discharge.        Left eye: No discharge.     Conjunctiva/sclera: Conjunctivae normal.  Cardiovascular:     Rate and Rhythm: Normal rate and regular rhythm.     Heart sounds: Normal heart sounds. No murmur heard.   No friction rub. No gallop.  Pulmonary:     Effort: Pulmonary effort is normal. No respiratory distress.     Breath sounds: Normal breath sounds. No stridor. No wheezing, rhonchi or rales.  Musculoskeletal:        General: Tenderness (right foot injury, wearing boot) present. Normal range of motion.     Cervical back: Normal range of motion.      Right lower leg: No edema.     Left lower leg: No edema.  Skin:    General: Skin is warm and dry.  Neurological:     Mental Status: He is alert and oriented to person, place, and time. Mental status is at baseline.     Gait: Gait abnormal (walking with crutches).  Psychiatric:        Mood and Affect: Mood normal.        Behavior: Behavior normal.        Thought Content: Thought content normal.        Judgment: Judgment normal.    No results found for: TSH Lab Results  Component Value Date   WBC 8.5 07/25/2020   HGB 15.5 07/25/2020   HCT 45.4 07/25/2020   MCV 100.9 (H) 07/25/2020   PLT 275 07/25/2020   Lab Results  Component Value Date   NA 138 07/25/2020   K 4.3 07/25/2020   CO2 24 07/25/2020   GLUCOSE 119 (H) 07/25/2020   BUN 14 07/25/2020   CREATININE 0.84 07/25/2020   BILITOT 0.7 07/25/2020   ALKPHOS 42 07/25/2020   AST 18 07/25/2020   ALT 22 07/25/2020   PROT 7.1 07/25/2020   ALBUMIN 4.4 07/25/2020   CALCIUM 9.3 07/25/2020   ANIONGAP 8 07/25/2020   No results found for: CHOL No results found for: HDL No results found for: LDLCALC No results found for: TRIG No results found for: CHOLHDL No results found for: [id number]

## 2022-01-15 ENCOUNTER — Encounter: Payer: Self-pay | Admitting: Family Medicine

## 2022-01-15 DIAGNOSIS — S338XXA Sprain of other parts of lumbar spine and pelvis, initial encounter: Secondary | ICD-10-CM | POA: Diagnosis not present

## 2022-01-15 DIAGNOSIS — F411 Generalized anxiety disorder: Secondary | ICD-10-CM | POA: Insufficient documentation

## 2022-01-15 DIAGNOSIS — S233XXA Sprain of ligaments of thoracic spine, initial encounter: Secondary | ICD-10-CM | POA: Diagnosis not present

## 2022-01-15 DIAGNOSIS — S99921A Unspecified injury of right foot, initial encounter: Secondary | ICD-10-CM | POA: Insufficient documentation

## 2022-01-15 DIAGNOSIS — F410 Panic disorder [episodic paroxysmal anxiety] without agoraphobia: Secondary | ICD-10-CM | POA: Insufficient documentation

## 2022-01-15 DIAGNOSIS — S134XXA Sprain of ligaments of cervical spine, initial encounter: Secondary | ICD-10-CM | POA: Diagnosis not present

## 2022-01-15 DIAGNOSIS — J4530 Mild persistent asthma, uncomplicated: Secondary | ICD-10-CM | POA: Insufficient documentation

## 2022-01-15 DIAGNOSIS — G250 Essential tremor: Secondary | ICD-10-CM | POA: Insufficient documentation

## 2022-02-06 ENCOUNTER — Other Ambulatory Visit: Payer: Self-pay | Admitting: Family Medicine

## 2022-02-06 DIAGNOSIS — F411 Generalized anxiety disorder: Secondary | ICD-10-CM

## 2022-02-08 DIAGNOSIS — S134XXA Sprain of ligaments of cervical spine, initial encounter: Secondary | ICD-10-CM | POA: Diagnosis not present

## 2022-02-08 DIAGNOSIS — S338XXA Sprain of other parts of lumbar spine and pelvis, initial encounter: Secondary | ICD-10-CM | POA: Diagnosis not present

## 2022-02-08 DIAGNOSIS — S233XXA Sprain of ligaments of thoracic spine, initial encounter: Secondary | ICD-10-CM | POA: Diagnosis not present

## 2022-02-11 DIAGNOSIS — G90521 Complex regional pain syndrome I of right lower limb: Secondary | ICD-10-CM | POA: Diagnosis not present

## 2022-02-11 DIAGNOSIS — S92341B Displaced fracture of fourth metatarsal bone, right foot, initial encounter for open fracture: Secondary | ICD-10-CM | POA: Diagnosis not present

## 2022-02-11 DIAGNOSIS — Z9889 Other specified postprocedural states: Secondary | ICD-10-CM | POA: Diagnosis not present

## 2022-02-11 DIAGNOSIS — S91301D Unspecified open wound, right foot, subsequent encounter: Secondary | ICD-10-CM | POA: Diagnosis not present

## 2022-03-05 ENCOUNTER — Ambulatory Visit (INDEPENDENT_AMBULATORY_CARE_PROVIDER_SITE_OTHER): Payer: BC Managed Care – PPO | Admitting: Family Medicine

## 2022-03-05 ENCOUNTER — Encounter: Payer: Self-pay | Admitting: Family Medicine

## 2022-03-05 VITALS — BP 129/70 | HR 80 | Temp 98.1°F | Ht 74.0 in | Wt 207.0 lb

## 2022-03-05 DIAGNOSIS — F411 Generalized anxiety disorder: Secondary | ICD-10-CM

## 2022-03-05 DIAGNOSIS — J4530 Mild persistent asthma, uncomplicated: Secondary | ICD-10-CM

## 2022-03-05 DIAGNOSIS — Z136 Encounter for screening for cardiovascular disorders: Secondary | ICD-10-CM | POA: Diagnosis not present

## 2022-03-05 DIAGNOSIS — F410 Panic disorder [episodic paroxysmal anxiety] without agoraphobia: Secondary | ICD-10-CM

## 2022-03-05 DIAGNOSIS — Z0001 Encounter for general adult medical examination with abnormal findings: Secondary | ICD-10-CM | POA: Diagnosis not present

## 2022-03-05 DIAGNOSIS — Z0000 Encounter for general adult medical examination without abnormal findings: Secondary | ICD-10-CM

## 2022-03-05 DIAGNOSIS — G250 Essential tremor: Secondary | ICD-10-CM

## 2022-03-05 MED ORDER — ESCITALOPRAM OXALATE 20 MG PO TABS: 20.0000 mg | ORAL_TABLET | Freq: Every day | ORAL | 1 refills | Status: DC

## 2022-03-05 MED ORDER — HYDROXYZINE HCL 10 MG PO TABS: 10.0000 mg | ORAL_TABLET | Freq: Three times a day (TID) | ORAL | 2 refills | Status: DC | PRN

## 2022-03-05 NOTE — Patient Instructions (Signed)
Try using the Spiriva copay card on the Internet. You can just google it and find it. ?

## 2022-03-05 NOTE — Progress Notes (Signed)
Assessment & Plan:  1. Well adult exam Preventive health education provided. - CBC with Differential/Platelet - CMP14+EGFR - Lipid panel  2. Panic attacks Well controlled on current regimen.  - hydrOXYzine (ATARAX) 10 MG tablet; Take 1 tablet (10 mg total) by mouth 3 (three) times daily as needed for anxiety.  Dispense: 30 tablet; Refill: 2  3. Generalized anxiety disorder Well controlled on current regimen.  - escitalopram (LEXAPRO) 20 MG tablet; Take 1 tablet (20 mg total) by mouth daily.  Dispense: 90 tablet; Refill: 1  4. Mild persistent asthma without complication Encouraged to use online copay card for Spiriva and if he cannot get it cheaper, let me know so I can send something else in.  5. Essential tremor Declined primidone. Unable to take propranolol since he has asthma. He is going to take gabapentin as prescribed and see if this improves his tremor; he was previously only taking 1/2 tablet as he was afraid it would be too much.   Follow-up: Return in about 6 months (around 09/05/2022) for follow-up of chronic medication conditions.   Hendricks Limes, MSN, APRN, FNP-C [facility]  Subjective:  Patient ID: Brendan Ponce, male    DOB: [birth year]  Age: 41 y.o. MRN: [id number]  Patient Care Team: Loman Brooklyn, FNP as PCP - General (Family Medicine)   CC:  Chief Complaint  Patient presents with   Annual Exam    HPI Brendan Ponce presents for his annual physical.   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  He was also prescribed hydroxyzine to take as needed.  Depression screen [facility] 2/9 01/14/2022 12/01/2016  Decreased Interest 0 0  Down, Depressed, Hopeless 1 0  PHQ - 2 Score 1 0  Altered sleeping 3 -  Tired, decreased energy 1 -  Change in appetite 1 -  Feeling bad or failure about yourself  2 -  Trouble concentrating 2 -  Moving slowly or fidgety/restless 1 -  Suicidal thoughts 0 -  PHQ-9 Score 11 -  Difficult doing work/chores Not difficult at all -   GAD 7 : Generalized Anxiety Score 01/14/2022  Nervous, Anxious, on Edge 2  Control/stop worrying 2  Worry too much - different things 2  Trouble relaxing 2  Restless 2  Easily annoyed or irritable 1  Afraid - awful might happen 1  Total GAD 7 Score 12  Anxiety Difficulty Somewhat difficult    Asthma: prescribed Spiriva at our last visit, but never picked it up due to cost.   Tremor: patient reports a tremor of the left arm x8 years that has gotten worse. His dad and mom also has an essential tremor.    Review of Systems  Constitutional:  Negative for chills, fever, malaise/fatigue and weight loss.  HENT:  Negative for congestion, ear discharge, ear pain, nosebleeds, sinus pain, sore throat and tinnitus.   Eyes:  Negative for blurred vision, double vision, pain, discharge and redness.  Respiratory:  Negative for cough, shortness of breath and wheezing.   Cardiovascular:  Negative for chest pain, palpitations and leg swelling.  Gastrointestinal:  Negative for abdominal pain, constipation, diarrhea, heartburn, nausea and vomiting.  Genitourinary:  Negative for dysuria, frequency and urgency.       Denies trouble initiating a urine stream, weak stream, split stream, nocturia, and dribbling.   Musculoskeletal:  Negative for myalgias.  Skin:  Negative for rash.  Neurological:  Negative for dizziness, seizures, weakness and headaches.  Psychiatric/Behavioral:  Negative for depression, substance abuse and suicidal ideas. The patient  is not nervous/anxious.     Current Outpatient Medications:    albuterol (VENTOLIN HFA) 108 (90 Base) MCG/ACT inhaler, Inhale 2 puffs into the lungs every 6 (six) hours as needed for wheezing or shortness of breath., Disp: 36 g, Rfl: 2   escitalopram (LEXAPRO) 20 MG tablet, TAKE 1 TABLET BY MOUTH EVERY DAY, Disp: 90 tablet, Rfl: 0   gabapentin (NEURONTIN) 300 MG capsule, Take 2 capsules by mouth in the morning, at noon, and at bedtime., Disp: , Rfl:    hydrOXYzine (ATARAX) 10 MG tablet, Take 1 tablet (10 mg total) by mouth 3 (three) times daily as needed for anxiety., Disp: 30 tablet, Rfl: 2   ibuprofen (ADVIL) 800 MG tablet, Take 800 mg by mouth every 8 (eight) hours as needed., Disp: , Rfl:    oxycodone (OXY-IR) 5 MG capsule, Take 5 mg by mouth every 4 (four) hours as needed., Disp: , Rfl:    Tiotropium Bromide Monohydrate (SPIRIVA RESPIMAT) 1.25 MCG/ACT AERS, Inhale 2 puffs into the lungs daily., Disp: 4 g, Rfl: 2   tiZANidine (ZANAFLEX) 4 MG capsule, Take 4 mg by mouth 3 (three) times daily., Disp: , Rfl:   No Known Allergies  Past Medical History:  Diagnosis Date   Anxiety    Asthma     Past Surgical History:  Procedure Laterality Date   FOOT SURGERY Right     Family History  Problem Relation Age of Onset   Cancer Mother        pancreatic   Cancer Father        kidney   Asthma Daughter    Asthma Son     Social History   Socioeconomic History   Marital status: Married    Spouse name: Not on file   Number of children: Not on file   Years of education: Not on file   Highest education level: Not on file  Occupational History   Not on file  Tobacco Use   Smoking status: Some Days    Types: Cigarettes   Smokeless tobacco: Current    Types: Chew  Vaping Use   Vaping Use: Never used  Substance and Sexual Activity   Alcohol use: No   Drug use: No   Sexual activity: Not on file  Other Topics Concern   Not on file  Social History Narrative   Not on file    Social Determinants of Health   Financial Resource Strain: Not on file  Food Insecurity: Not on file  Transportation Needs: Not on file  Physical Activity: Not on file  Stress: Not on file  Social Connections: Not on file  Intimate Partner Violence: Not on file      Objective:    There were no vitals taken for this visit.  Wt Readings from Last 3 Encounters:  01/14/22 210 lb (95.3 kg)  07/25/20 (!) 210 lb (95.3 kg)  12/01/16 202 lb (91.6 kg)    Physical Exam Vitals reviewed.  Constitutional:      General: He is not in acute distress.  Appearance: Normal appearance. He is not ill-appearing, toxic-appearing or diaphoretic.  HENT:     Head: Normocephalic and atraumatic.     Right Ear: Tympanic membrane, ear canal and external ear normal. There is no impacted cerumen.     Left Ear: Tympanic membrane, ear canal and external ear normal. There is no impacted cerumen.     Nose: Nose normal. No congestion or rhinorrhea.     Mouth/Throat:     Mouth: Mucous membranes are moist.     Pharynx: Oropharynx is clear. No oropharyngeal exudate or posterior oropharyngeal erythema.  Eyes:     General: No scleral icterus.       Right eye: No discharge.        Left eye: No discharge.     Conjunctiva/sclera: Conjunctivae normal.     Pupils: Pupils are equal, round, and reactive to light.  Neck:     Vascular: No carotid bruit.  Cardiovascular:     Rate and Rhythm: Normal rate and regular rhythm.     Heart sounds: Normal heart sounds. No murmur heard.   No friction rub. No gallop.  Pulmonary:     Effort: Pulmonary effort is normal. No respiratory distress.     Breath sounds: Normal breath sounds. No stridor. No wheezing, rhonchi or rales.  Abdominal:     General: Abdomen is flat. Bowel sounds are normal. There is no distension.     Palpations: Abdomen is soft. There is no hepatomegaly, splenomegaly or mass.     Tenderness: There is no abdominal tenderness. There is no guarding or  rebound.     Hernia: No hernia is present.  Musculoskeletal:        General: Normal range of motion.     Cervical back: Normal range of motion and neck supple. No rigidity. No muscular tenderness.     Right lower leg: No edema.     Left lower leg: No edema.  Lymphadenopathy:     Cervical: No cervical adenopathy.  Skin:    General: Skin is warm and dry.     Capillary Refill: Capillary refill takes less than 2 seconds.  Neurological:     General: No focal deficit present.     Mental Status: He is alert and oriented to person, place, and time. Mental status is at baseline.     Motor: Tremor (left arm) present.  Psychiatric:        Mood and Affect: Mood normal.        Behavior: Behavior normal.        Thought Content: Thought content normal.        Judgment: Judgment normal.    No results found for: TSH Lab Results  Component Value Date   WBC 8.5 07/25/2020   HGB 15.5 07/25/2020   HCT 45.4 07/25/2020   MCV 100.9 (H) 07/25/2020   PLT 275 07/25/2020   Lab Results  Component Value Date   NA 138 07/25/2020   K 4.3 07/25/2020   CO2 24 07/25/2020   GLUCOSE 119 (H) 07/25/2020   BUN 14 07/25/2020   CREATININE 0.84 07/25/2020   BILITOT 0.7 07/25/2020   ALKPHOS 42 07/25/2020   AST 18 07/25/2020   ALT 22 07/25/2020   PROT 7.1 07/25/2020   ALBUMIN 4.4 07/25/2020   CALCIUM 9.3 07/25/2020   ANIONGAP 8 07/25/2020   No results found for: CHOL No results found for: HDL No results found for: LDLCALC No results found for: TRIG No results found for: CHOLHDL No results  found for: HGBA1C

## 2022-03-06 LAB — CBC WITH DIFFERENTIAL/PLATELET
Basophils Absolute: 0.1 10*3/uL (ref 0.0–0.2)
Basos: 1 %
EOS (ABSOLUTE): 0.2 10*3/uL (ref 0.0–0.4)
Eos: 3 %
Hematocrit: 46.7 % (ref 37.5–51.0)
Hemoglobin: 15.8 g/dL (ref 13.0–17.7)
Immature Grans (Abs): 0 10*3/uL (ref 0.0–0.1)
Immature Granulocytes: 0 %
Lymphocytes Absolute: 1.6 10*3/uL (ref 0.7–3.1)
Lymphs: 25 %
MCH: 31.4 pg (ref 26.6–33.0)
MCHC: 33.8 g/dL (ref 31.5–35.7)
MCV: 93 fL (ref 79–97)
Monocytes Absolute: 0.4 10*3/uL (ref 0.1–0.9)
Monocytes: 7 %
Neutrophils Absolute: 3.9 10*3/uL (ref 1.4–7.0)
Neutrophils: 64 %
Platelets: 297 10*3/uL (ref 150–450)
RBC: 5.03 x10E6/uL (ref 4.14–5.80)
RDW: 12.2 % (ref 11.6–15.4)
WBC: 6.2 10*3/uL (ref 3.4–10.8)

## 2022-03-06 LAB — CMP14+EGFR
ALT: 22 IU/L (ref 0–44)
AST: 21 IU/L (ref 0–40)
Albumin/Globulin Ratio: 1.8 (ref 1.2–2.2)
Albumin: 4.6 g/dL (ref 4.0–5.0)
Alkaline Phosphatase: 62 IU/L (ref 44–121)
BUN/Creatinine Ratio: 17 (ref 9–20)
BUN: 15 mg/dL (ref 6–24)
Bilirubin Total: 0.4 mg/dL (ref 0.0–1.2)
CO2: 23 mmol/L (ref 20–29)
Calcium: 9.7 mg/dL (ref 8.7–10.2)
Chloride: 102 mmol/L (ref 96–106)
Creatinine, Ser: 0.9 mg/dL (ref 0.76–1.27)
Globulin, Total: 2.6 g/dL (ref 1.5–4.5)
Glucose: 111 mg/dL — ABNORMAL HIGH (ref 70–99)
Potassium: 4.1 mmol/L (ref 3.5–5.2)
Sodium: 139 mmol/L (ref 134–144)
Total Protein: 7.2 g/dL (ref 6.0–8.5)
eGFR: 109 mL/min/{1.73_m2} (ref >59)

## 2022-03-06 LAB — LIPID PANEL
Chol/HDL Ratio: 2.9 ratio (ref 0.0–5.0)
Cholesterol, Total: 163 mg/dL (ref 100–199)
HDL: 56 mg/dL (ref >39)
LDL Chol Calc (NIH): 88 mg/dL (ref 0–99)
Triglycerides: 107 mg/dL (ref 0–149)
VLDL Cholesterol Cal: 19 mg/dL (ref 5–40)

## 2022-03-31 DIAGNOSIS — S338XXA Sprain of other parts of lumbar spine and pelvis, initial encounter: Secondary | ICD-10-CM | POA: Diagnosis not present

## 2022-03-31 DIAGNOSIS — S233XXA Sprain of ligaments of thoracic spine, initial encounter: Secondary | ICD-10-CM | POA: Diagnosis not present

## 2022-03-31 DIAGNOSIS — S134XXA Sprain of ligaments of cervical spine, initial encounter: Secondary | ICD-10-CM | POA: Diagnosis not present

## 2022-04-15 ENCOUNTER — Ambulatory Visit (INDEPENDENT_AMBULATORY_CARE_PROVIDER_SITE_OTHER): Payer: BC Managed Care – PPO | Admitting: Family Medicine

## 2022-04-15 ENCOUNTER — Encounter: Payer: Self-pay | Admitting: Family Medicine

## 2022-04-15 VITALS — BP 111/58 | HR 75 | Temp 98.0°F | Ht 74.0 in | Wt 209.0 lb

## 2022-04-15 DIAGNOSIS — F418 Other specified anxiety disorders: Secondary | ICD-10-CM

## 2022-04-15 MED ORDER — CLONAZEPAM 0.5 MG PO TABS: 0.5000 mg | ORAL_TABLET | Freq: Two times a day (BID) | ORAL | 0 refills | Status: DC | PRN

## 2022-04-15 NOTE — Progress Notes (Signed)
? ?Assessment & Plan:  ?1. Situational anxiety ?Clonazepam prescribed for short-term use in situations related to his lawyer and court appearances.  Hydroxyzine was not helpful.  I did not prescribe propranolol since he has asthma.  He understands that this will not be a long-term prescription.  He also understands he cannot take this with his pain medication.  Education provided on mindfulness based stress reduction.  Encouraged deep breathing exercises when he is feeling really anxious.  Discussed the option of referring to therapy if he is interested, he will let me know. ?- clonazePAM (KLONOPIN) 0.5 MG tablet; Take 1 tablet (0.5 mg total) by mouth 2 (two) times daily as needed for anxiety.  Dispense: 30 tablet; Refill: 0 ? ? ?Follow up plan: Return as scheduled. ? ?[name], MSN, APRN, FNP-C ?[facility] ? ?Subjective:  ? ?Patient ID: Brendan Ponce, male    DOB: [birth year], 41 y.o.   MRN: [id number] ? ?HPI: ?Brendan Ponce is a 41 y.o. male presenting on 04/15/2022 for Panic Attack (Hydroxyzine is not working. He takes when he feels morse anxious and it does nothing. ) ? ?Patient reports he is going through something and is having a really hard time with his anxiety.  He is scheduled to go to court next month.  States he has never been in trouble before and did not do what he is being accused of.  When he last went to see his lawyer he had a panic attack in the office.  His lawyer is worried that he will not make it through court mentally due to the panic attacks.  He does feel his anxiety is overall better on the Lexapro, but that he needs help on days he has to go to his lawyer and court. ? ? ?ROS: Negative unless specifically indicated above in HPI.  ? ?Relevant past medical history reviewed and updated as indicated.  ? ?Allergies and medications reviewed and updated. ? ? ?Current Outpatient Medications:  ?  albuterol (VENTOLIN HFA) 108 (90 Base) MCG/ACT inhaler, Inhale 2 puffs into  the lungs every 6 (six) hours as needed for wheezing or shortness of breath., Disp: 36 g, Rfl: 2 ?  escitalopram (LEXAPRO) 20 MG tablet, Take 1 tablet (20 mg total) by mouth daily., Disp: 90 tablet, Rfl: 1 ?  gabapentin (NEURONTIN) 300 MG capsule, Take 2 capsules by mouth in the morning, at noon, and at bedtime., Disp: , Rfl:  ?  hydrOXYzine (ATARAX) 10 MG tablet, Take 1 tablet (10 mg total) by mouth 3 (three) times daily as needed for anxiety., Disp: 30 tablet, Rfl: 2 ?  ibuprofen (ADVIL) 800 MG tablet, Take 800 mg by mouth every 8 (eight) hours as needed., Disp: , Rfl:  ?  oxycodone (OXY-IR) 5 MG capsule, Take 5 mg by mouth every 4 (four) hours as needed., Disp: , Rfl:  ?  tiZANidine (ZANAFLEX) 4 MG capsule, Take 4 mg by mouth 3 (three) times daily., Disp: , Rfl:  ?  Tiotropium Bromide Monohydrate (SPIRIVA RESPIMAT) 1.25 MCG/ACT AERS, Inhale 2 puffs into the lungs daily. (Patient not taking: Reported on 03/05/2022), Disp: 4 g, Rfl: 2 ? ?No Known Allergies ? ?Objective:  ? ?BP (!) 111/58   Pulse 75   Temp 98 ?F (36.7 ?C) (Temporal)   Ht 6\' 2"  (1.88 m)   Wt 209 lb (94.8 kg)   SpO2 97%   BMI 26.83 kg/m?   ? ?Physical Exam ?Vitals reviewed.  ?Constitutional:   ?   General: He  is not in acute distress. ?   Appearance: Normal appearance. He is not ill-appearing, toxic-appearing or diaphoretic.  ?HENT:  ?   Head: Normocephalic and atraumatic.  ?Eyes:  ?   General: No scleral icterus.    ?   Right eye: No discharge.     ?   Left eye: No discharge.  ?   Conjunctiva/sclera: Conjunctivae normal.  ?Cardiovascular:  ?   Rate and Rhythm: Normal rate.  ?Pulmonary:  ?   Effort: Pulmonary effort is normal. No respiratory distress.  ?Musculoskeletal:     ?   General: Normal range of motion.  ?   Cervical back: Normal range of motion.  ?Skin: ?   General: Skin is warm and dry.  ?Neurological:  ?   Mental Status: He is alert and oriented to person, place, and time. Mental status is at baseline.  ?Psychiatric:     ?   Attention  and Perception: Attention and perception normal.     ?   Mood and Affect: Mood is anxious.     ?   Speech: Speech normal.     ?   Behavior: Behavior normal.     ?   Thought Content: Thought content normal.     ?   Judgment: Judgment normal.  ? ? ? ? ? ? ?

## 2022-04-21 ENCOUNTER — Ambulatory Visit: Payer: BC Managed Care – PPO | Admitting: Family Medicine

## 2022-04-27 ENCOUNTER — Telehealth: Payer: Self-pay | Admitting: Family Medicine

## 2022-04-27 NOTE — Telephone Encounter (Signed)
Per provider patient aware not to take pain medicine with clonazepam.  ?

## 2022-04-27 NOTE — Telephone Encounter (Signed)
Pt wants to speak with PCP or nurse. Has questions about his Lexapro and Clonazepam Rx's. ?

## 2022-05-27 ENCOUNTER — Encounter: Payer: Self-pay | Admitting: Family Medicine

## 2022-05-27 ENCOUNTER — Ambulatory Visit (INDEPENDENT_AMBULATORY_CARE_PROVIDER_SITE_OTHER): Payer: BC Managed Care – PPO | Admitting: Family Medicine

## 2022-05-27 VITALS — BP 107/64 | HR 78 | Temp 98.2°F | Ht 74.0 in | Wt 203.0 lb

## 2022-05-27 DIAGNOSIS — F418 Other specified anxiety disorders: Secondary | ICD-10-CM

## 2022-05-27 DIAGNOSIS — R55 Syncope and collapse: Secondary | ICD-10-CM

## 2022-05-27 DIAGNOSIS — J4530 Mild persistent asthma, uncomplicated: Secondary | ICD-10-CM

## 2022-05-27 DIAGNOSIS — F410 Panic disorder [episodic paroxysmal anxiety] without agoraphobia: Secondary | ICD-10-CM | POA: Diagnosis not present

## 2022-05-27 DIAGNOSIS — F411 Generalized anxiety disorder: Secondary | ICD-10-CM | POA: Diagnosis not present

## 2022-05-27 MED ORDER — ESCITALOPRAM OXALATE 20 MG PO TABS: 20.0000 mg | ORAL_TABLET | Freq: Every day | ORAL | 1 refills | Status: DC

## 2022-05-27 MED ORDER — HYDROXYZINE HCL 25 MG PO TABS: 25.0000 mg | ORAL_TABLET | Freq: Three times a day (TID) | ORAL | 2 refills | Status: DC | PRN
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
## 2022-06-29 ENCOUNTER — Other Ambulatory Visit: Payer: Self-pay | Admitting: Family Medicine

## 2022-06-29 DIAGNOSIS — F410 Panic disorder [episodic paroxysmal anxiety] without agoraphobia: Secondary | ICD-10-CM

## 2022-07-08 DIAGNOSIS — R109 Unspecified abdominal pain: Secondary | ICD-10-CM | POA: Diagnosis not present

## 2022-07-09 DIAGNOSIS — K5900 Constipation, unspecified: Secondary | ICD-10-CM | POA: Diagnosis not present

## 2022-07-09 DIAGNOSIS — R1012 Left upper quadrant pain: Secondary | ICD-10-CM | POA: Diagnosis not present

## 2022-08-11 ENCOUNTER — Encounter: Payer: Self-pay | Admitting: *Deleted

## 2022-09-03 ENCOUNTER — Ambulatory Visit: Payer: BC Managed Care – PPO | Admitting: Family Medicine

## 2022-09-14 DIAGNOSIS — M722 Plantar fascial fibromatosis: Secondary | ICD-10-CM | POA: Diagnosis not present

## 2022-09-21 ENCOUNTER — Encounter: Payer: Self-pay | Admitting: *Deleted

## 2022-10-11 DIAGNOSIS — S62632A Displaced fracture of distal phalanx of right middle finger, initial encounter for closed fracture: Secondary | ICD-10-CM | POA: Diagnosis not present

## 2022-11-08 ENCOUNTER — Ambulatory Visit (INDEPENDENT_AMBULATORY_CARE_PROVIDER_SITE_OTHER): Payer: BC Managed Care – PPO | Admitting: Nurse Practitioner

## 2022-11-08 ENCOUNTER — Encounter: Payer: Self-pay | Admitting: Nurse Practitioner

## 2022-11-08 VITALS — BP 134/71 | HR 69 | Temp 97.8°F | Ht 74.0 in | Wt 209.4 lb

## 2022-11-08 DIAGNOSIS — Z024 Encounter for examination for driving license: Secondary | ICD-10-CM

## 2022-11-08 LAB — URINALYSIS
Bilirubin, UA: NEGATIVE
Glucose, UA: NEGATIVE
Ketones, UA: NEGATIVE
Leukocytes,UA: NEGATIVE
Nitrite, UA: NEGATIVE
Protein,UA: NEGATIVE
RBC, UA: NEGATIVE
Specific Gravity, UA: 1.025 (ref 1.005–1.030)
Urobilinogen, Ur: 0.2 mg/dL (ref 0.2–1.0)
pH, UA: 5.5 (ref 5.0–7.5)

## 2022-11-08 NOTE — Progress Notes (Signed)
[employer]- see scanned in document 

## 2022-11-26 ENCOUNTER — Ambulatory Visit: Payer: BC Managed Care – PPO | Admitting: Family Medicine

## 2022-11-29 ENCOUNTER — Ambulatory Visit: Payer: BC Managed Care – PPO | Admitting: Family Medicine

## 2022-12-02 ENCOUNTER — Encounter: Payer: Self-pay | Admitting: Family Medicine

## 2022-12-08 ENCOUNTER — Other Ambulatory Visit: Payer: Self-pay | Admitting: Family Medicine

## 2022-12-08 DIAGNOSIS — F411 Generalized anxiety disorder: Secondary | ICD-10-CM

## 2023-01-13 DIAGNOSIS — Z4789 Encounter for other orthopedic aftercare: Secondary | ICD-10-CM | POA: Diagnosis not present

## 2023-02-14 DIAGNOSIS — S233XXA Sprain of ligaments of thoracic spine, initial encounter: Secondary | ICD-10-CM | POA: Diagnosis not present

## 2023-02-14 DIAGNOSIS — S134XXA Sprain of ligaments of cervical spine, initial encounter: Secondary | ICD-10-CM | POA: Diagnosis not present

## 2023-02-14 DIAGNOSIS — S338XXA Sprain of other parts of lumbar spine and pelvis, initial encounter: Secondary | ICD-10-CM | POA: Diagnosis not present

## 2023-02-21 DIAGNOSIS — G90521 Complex regional pain syndrome I of right lower limb: Secondary | ICD-10-CM | POA: Diagnosis not present

## 2023-02-28 DIAGNOSIS — S338XXA Sprain of other parts of lumbar spine and pelvis, initial encounter: Secondary | ICD-10-CM | POA: Diagnosis not present

## 2023-02-28 DIAGNOSIS — S233XXA Sprain of ligaments of thoracic spine, initial encounter: Secondary | ICD-10-CM | POA: Diagnosis not present

## 2023-02-28 DIAGNOSIS — S134XXA Sprain of ligaments of cervical spine, initial encounter: Secondary | ICD-10-CM | POA: Diagnosis not present

## 2023-03-10 ENCOUNTER — Other Ambulatory Visit: Payer: Self-pay | Admitting: Family Medicine

## 2023-03-10 DIAGNOSIS — J4530 Mild persistent asthma, uncomplicated: Secondary | ICD-10-CM

## 2023-04-05 ENCOUNTER — Other Ambulatory Visit: Payer: Self-pay | Admitting: Family Medicine

## 2023-04-05 DIAGNOSIS — J4530 Mild persistent asthma, uncomplicated: Secondary | ICD-10-CM

## 2023-04-09 ENCOUNTER — Other Ambulatory Visit: Payer: Self-pay | Admitting: Family Medicine

## 2023-04-09 DIAGNOSIS — F411 Generalized anxiety disorder: Secondary | ICD-10-CM

## 2023-04-19 DIAGNOSIS — G90521 Complex regional pain syndrome I of right lower limb: Secondary | ICD-10-CM | POA: Diagnosis not present

## 2023-04-27 ENCOUNTER — Ambulatory Visit (INDEPENDENT_AMBULATORY_CARE_PROVIDER_SITE_OTHER): Payer: BC Managed Care – PPO | Admitting: Family Medicine

## 2023-04-27 ENCOUNTER — Encounter: Payer: Self-pay | Admitting: Family Medicine

## 2023-04-27 VITALS — BP 116/67 | HR 77 | Temp 98.1°F | Ht 74.0 in | Wt 213.5 lb

## 2023-04-27 DIAGNOSIS — F411 Generalized anxiety disorder: Secondary | ICD-10-CM | POA: Diagnosis not present

## 2023-04-27 DIAGNOSIS — F410 Panic disorder [episodic paroxysmal anxiety] without agoraphobia: Secondary | ICD-10-CM | POA: Diagnosis not present

## 2023-04-27 DIAGNOSIS — G90521 Complex regional pain syndrome I of right lower limb: Secondary | ICD-10-CM | POA: Diagnosis not present

## 2023-04-27 DIAGNOSIS — J4530 Mild persistent asthma, uncomplicated: Secondary | ICD-10-CM | POA: Diagnosis not present

## 2023-04-27 MED ORDER — HYDROXYZINE HCL 25 MG PO TABS
25.0000 mg | ORAL_TABLET | Freq: Three times a day (TID) | ORAL | 0 refills | Status: DC | PRN
Start: 2023-04-27 — End: 2024-05-04

## 2023-04-27 MED ORDER — ESCITALOPRAM OXALATE 20 MG PO TABS: 20.0000 mg | ORAL_TABLET | Freq: Every day | ORAL | 3 refills | Status: DC

## 2023-04-27 MED ORDER — ALBUTEROL SULFATE HFA 108 (90 BASE) MCG/ACT IN AERS
2.0000 | INHALATION_SPRAY | Freq: Four times a day (QID) | RESPIRATORY_TRACT | 0 refills | Status: DC | PRN
Start: 2023-04-27 — End: 2023-05-19
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
## 2023-05-06 ENCOUNTER — Ambulatory Visit: Payer: BC Managed Care – PPO | Admitting: Nurse Practitioner

## 2023-05-19 ENCOUNTER — Other Ambulatory Visit: Payer: Self-pay | Admitting: Family Medicine

## 2023-05-19 DIAGNOSIS — J4530 Mild persistent asthma, uncomplicated: Secondary | ICD-10-CM

## 2023-08-25 ENCOUNTER — Emergency Department (HOSPITAL_COMMUNITY): Payer: BC Managed Care – PPO

## 2023-08-25 ENCOUNTER — Other Ambulatory Visit: Payer: Self-pay

## 2023-08-25 ENCOUNTER — Encounter (HOSPITAL_COMMUNITY): Payer: Self-pay

## 2023-08-25 ENCOUNTER — Emergency Department (HOSPITAL_COMMUNITY)
Admission: EM | Admit: 2023-08-25 | Discharge: 2023-08-25 | Disposition: A | Discharge status: Home / Self Care | Payer: BC Managed Care – PPO | Source: Home / Self Care | Attending: Emergency Medicine | Admitting: Emergency Medicine

## 2023-08-25 DIAGNOSIS — S199XXA Unspecified injury of neck, initial encounter: Secondary | ICD-10-CM | POA: Diagnosis not present

## 2023-08-25 DIAGNOSIS — Y92410 Unspecified street and highway as the place of occurrence of the external cause: Secondary | ICD-10-CM | POA: Diagnosis not present

## 2023-08-25 DIAGNOSIS — M47816 Spondylosis without myelopathy or radiculopathy, lumbar region: Secondary | ICD-10-CM | POA: Diagnosis not present

## 2023-08-25 DIAGNOSIS — M79605 Pain in left leg: Secondary | ICD-10-CM | POA: Diagnosis not present

## 2023-08-25 DIAGNOSIS — S299XXA Unspecified injury of thorax, initial encounter: Secondary | ICD-10-CM | POA: Diagnosis not present

## 2023-08-25 DIAGNOSIS — R55 Syncope and collapse: Secondary | ICD-10-CM | POA: Insufficient documentation

## 2023-08-25 DIAGNOSIS — R0689 Other abnormalities of breathing: Secondary | ICD-10-CM | POA: Diagnosis not present

## 2023-08-25 DIAGNOSIS — S3991XA Unspecified injury of abdomen, initial encounter: Secondary | ICD-10-CM | POA: Diagnosis not present

## 2023-08-25 DIAGNOSIS — Z041 Encounter for examination and observation following transport accident: Secondary | ICD-10-CM | POA: Diagnosis not present

## 2023-08-25 DIAGNOSIS — M25561 Pain in right knee: Secondary | ICD-10-CM | POA: Diagnosis not present

## 2023-08-25 DIAGNOSIS — S3993XA Unspecified injury of pelvis, initial encounter: Secondary | ICD-10-CM | POA: Diagnosis not present

## 2023-08-25 DIAGNOSIS — I499 Cardiac arrhythmia, unspecified: Secondary | ICD-10-CM | POA: Diagnosis not present

## 2023-08-25 DIAGNOSIS — Z0389 Encounter for observation for other suspected diseases and conditions ruled out: Secondary | ICD-10-CM | POA: Diagnosis not present

## 2023-08-25 DIAGNOSIS — M47812 Spondylosis without myelopathy or radiculopathy, cervical region: Secondary | ICD-10-CM | POA: Diagnosis not present

## 2023-08-25 LAB — I-STAT CHEM 8, ED
BUN: 15 mg/dL (ref 6–20)
Calcium, Ion: 1.17 mmol/L (ref 1.15–1.40)
Chloride: 105 mmol/L (ref 98–111)
Creatinine, Ser: 0.8 mg/dL (ref 0.61–1.24)
Glucose, Bld: 109 mg/dL — ABNORMAL HIGH (ref 70–99)
HCT: 42 % (ref 39.0–52.0)
Hemoglobin: 14.3 g/dL (ref 13.0–17.0)
Potassium: 3.9 mmol/L (ref 3.5–5.1)
Sodium: 140 mmol/L (ref 135–145)
TCO2: 24 mmol/L (ref 22–32)

## 2023-08-25 LAB — CBC
HCT: 41.8 % (ref 39.0–52.0)
Hemoglobin: 14.4 g/dL (ref 13.0–17.0)
MCH: 32.5 pg (ref 26.0–34.0)
MCHC: 34.4 g/dL (ref 30.0–36.0)
MCV: 94.4 fL (ref 80.0–100.0)
Platelets: 239 10*3/uL (ref 150–400)
RBC: 4.43 MIL/uL (ref 4.22–5.81)
RDW: 12.2 % (ref 11.5–15.5)
WBC: 8.8 10*3/uL (ref 4.0–10.5)
nRBC: 0 % (ref 0.0–0.2)

## 2023-08-25 LAB — PROTIME-INR
INR: 1 (ref 0.8–1.2)
Prothrombin Time: 13.8 seconds (ref 11.4–15.2)

## 2023-08-25 LAB — SAMPLE TO BLOOD BANK

## 2023-08-25 LAB — I-STAT CG4 LACTIC ACID, ED: Lactic Acid, Venous: 0.9 mmol/L (ref 0.5–1.9)

## 2023-08-25 LAB — COMPREHENSIVE METABOLIC PANEL
ALT: 17 U/L (ref 0–44)
AST: 18 U/L (ref 15–41)
Albumin: 3.9 g/dL (ref 3.5–5.0)
Alkaline Phosphatase: 45 U/L (ref 38–126)
Anion gap: 6 (ref 5–15)
BUN: 14 mg/dL (ref 6–20)
CO2: 25 mmol/L (ref 22–32)
Calcium: 8.6 mg/dL — ABNORMAL LOW (ref 8.9–10.3)
Chloride: 105 mmol/L (ref 98–111)
Creatinine, Ser: 0.84 mg/dL (ref 0.61–1.24)
GFR, Estimated: >60 mL/min (ref >60)
Glucose, Bld: 110 mg/dL — ABNORMAL HIGH (ref 70–99)
Potassium: 3.7 mmol/L (ref 3.5–5.1)
Sodium: 136 mmol/L (ref 135–145)
Total Bilirubin: 1 mg/dL (ref 0.3–1.2)
Total Protein: 6.3 g/dL — ABNORMAL LOW (ref 6.5–8.1)

## 2023-08-25 LAB — ETHANOL: Alcohol, Ethyl (B): <10 mg/dL (ref <10)

## 2023-08-25 MED ORDER — OXYCODONE HCL 5 MG PO TABS
5.0000 mg | ORAL_TABLET | Freq: Once | ORAL | Status: AC
  Administered 2023-08-25: 5 mg via ORAL
  Filled 2023-08-25: qty 1

## 2023-08-25 MED ORDER — FENTANYL CITRATE PF 50 MCG/ML IJ SOSY
50.0000 ug | PREFILLED_SYRINGE | Freq: Once | INTRAMUSCULAR | Status: AC
  Administered 2023-08-25: 50 ug via INTRAVENOUS
  Filled 2023-08-25: qty 1

## 2023-08-25 MED ORDER — ACETAMINOPHEN 500 MG PO TABS
1000.0000 mg | ORAL_TABLET | Freq: Once | ORAL | Status: AC
  Administered 2023-08-25: 1000 mg via ORAL
  Filled 2023-08-25: qty 2

## 2023-08-25 MED ORDER — IOHEXOL 350 MG/ML SOLN
75.0000 mL | Freq: Once | INTRAVENOUS | Status: AC | PRN
  Administered 2023-08-25: 75 mL via INTRAVENOUS

## 2023-08-25 MED ORDER — SODIUM CHLORIDE 0.9 % IV BOLUS
1000.0000 mL | Freq: Once | INTRAVENOUS | Status: AC
  Administered 2023-08-25: 1000 mL via INTRAVENOUS
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
## 2023-09-07 ENCOUNTER — Telehealth: Payer: Self-pay

## 2023-09-07 NOTE — Telephone Encounter (Signed)
Transition Care Management Unsuccessful Follow-up Telephone Call  Date of discharge and from where:  08/25/2023 [facility]  Attempts:  2nd Attempt  Reason for unsuccessful TCM follow-up call:  Left voice message  [facility]  [facility], [facility] Resource Care Guide Direct Dial: [phone]  Website: [email or web address]

## 2023-09-07 NOTE — Telephone Encounter (Signed)
Transition Care Management Unsuccessful Follow-up Telephone Call  Date of discharge and from where:  08/25/2023 [facility]  Attempts:  1st Attempt  Reason for unsuccessful TCM follow-up call:  Left voice message  [facility]  [facility], [facility] Resource Care Guide Direct Dial: [phone]  Website: [email or web address]

## 2023-10-12 DIAGNOSIS — R55 Syncope and collapse: Secondary | ICD-10-CM | POA: Diagnosis not present

## 2023-10-12 DIAGNOSIS — S99921S Unspecified injury of right foot, sequela: Secondary | ICD-10-CM | POA: Diagnosis not present

## 2023-10-12 DIAGNOSIS — G250 Essential tremor: Secondary | ICD-10-CM | POA: Diagnosis not present

## 2023-10-12 DIAGNOSIS — G90521 Complex regional pain syndrome I of right lower limb: Secondary | ICD-10-CM | POA: Diagnosis not present

## 2023-10-17 DIAGNOSIS — R55 Syncope and collapse: Secondary | ICD-10-CM | POA: Diagnosis not present

## 2023-10-28 ENCOUNTER — Ambulatory Visit: Payer: BC Managed Care – PPO | Admitting: Family Medicine

## 2023-12-23 DIAGNOSIS — G250 Essential tremor: Secondary | ICD-10-CM | POA: Diagnosis not present

## 2024-02-20 DIAGNOSIS — S91301D Unspecified open wound, right foot, subsequent encounter: Secondary | ICD-10-CM | POA: Diagnosis not present

## 2024-04-19 DIAGNOSIS — R52 Pain, unspecified: Secondary | ICD-10-CM | POA: Diagnosis not present

## 2024-04-19 DIAGNOSIS — S134XXA Sprain of ligaments of cervical spine, initial encounter: Secondary | ICD-10-CM | POA: Diagnosis not present

## 2024-04-19 DIAGNOSIS — M5136 Other intervertebral disc degeneration, lumbar region with discogenic back pain only: Secondary | ICD-10-CM | POA: Diagnosis not present

## 2024-04-19 DIAGNOSIS — M5137 Other intervertebral disc degeneration, lumbosacral region with discogenic back pain only: Secondary | ICD-10-CM | POA: Diagnosis not present

## 2024-04-19 DIAGNOSIS — S233XXA Sprain of ligaments of thoracic spine, initial encounter: Secondary | ICD-10-CM | POA: Diagnosis not present

## 2024-04-19 DIAGNOSIS — Z981 Arthrodesis status: Secondary | ICD-10-CM | POA: Diagnosis not present

## 2024-04-19 DIAGNOSIS — S338XXA Sprain of other parts of lumbar spine and pelvis, initial encounter: Secondary | ICD-10-CM | POA: Diagnosis not present

## 2024-04-19 DIAGNOSIS — M47816 Spondylosis without myelopathy or radiculopathy, lumbar region: Secondary | ICD-10-CM | POA: Diagnosis not present

## 2024-04-19 DIAGNOSIS — M25512 Pain in left shoulder: Secondary | ICD-10-CM | POA: Diagnosis not present

## 2024-04-25 DIAGNOSIS — S338XXA Sprain of other parts of lumbar spine and pelvis, initial encounter: Secondary | ICD-10-CM | POA: Diagnosis not present

## 2024-04-25 DIAGNOSIS — S134XXA Sprain of ligaments of cervical spine, initial encounter: Secondary | ICD-10-CM | POA: Diagnosis not present

## 2024-04-25 DIAGNOSIS — S233XXA Sprain of ligaments of thoracic spine, initial encounter: Secondary | ICD-10-CM | POA: Diagnosis not present

## 2024-05-01 ENCOUNTER — Other Ambulatory Visit: Payer: Self-pay | Admitting: Family Medicine

## 2024-05-01 ENCOUNTER — Encounter: Payer: Self-pay | Admitting: Family Medicine

## 2024-05-01 DIAGNOSIS — F411 Generalized anxiety disorder: Secondary | ICD-10-CM

## 2024-05-01 NOTE — Telephone Encounter (Signed)
Lmtcb letter mailed

## 2024-05-01 NOTE — Telephone Encounter (Signed)
 Needs to be seen with pcp

## 2024-05-04 ENCOUNTER — Encounter: Payer: Self-pay | Admitting: Family Medicine

## 2024-05-04 ENCOUNTER — Ambulatory Visit (INDEPENDENT_AMBULATORY_CARE_PROVIDER_SITE_OTHER): Admitting: Family Medicine

## 2024-05-04 VITALS — BP 112/69 | HR 78 | Temp 97.1°F | Ht 74.0 in | Wt 209.4 lb

## 2024-05-04 DIAGNOSIS — R251 Tremor, unspecified: Secondary | ICD-10-CM

## 2024-05-04 DIAGNOSIS — F411 Generalized anxiety disorder: Secondary | ICD-10-CM

## 2024-05-04 DIAGNOSIS — F410 Panic disorder [episodic paroxysmal anxiety] without agoraphobia: Secondary | ICD-10-CM | POA: Diagnosis not present

## 2024-05-04 DIAGNOSIS — G894 Chronic pain syndrome: Secondary | ICD-10-CM

## 2024-05-04 DIAGNOSIS — J4530 Mild persistent asthma, uncomplicated: Secondary | ICD-10-CM

## 2024-05-04 DIAGNOSIS — F320 Major depressive disorder, single episode, mild: Secondary | ICD-10-CM | POA: Diagnosis not present

## 2024-05-04 DIAGNOSIS — G90521 Complex regional pain syndrome I of right lower limb: Secondary | ICD-10-CM

## 2024-05-04 MED ORDER — ESCITALOPRAM OXALATE 20 MG PO TABS: 20.0000 mg | ORAL_TABLET | Freq: Every day | ORAL | 3 refills | Status: AC

## 2024-05-04 MED ORDER — HYDROXYZINE HCL 25 MG PO TABS
25.0000 mg | ORAL_TABLET | Freq: Three times a day (TID) | ORAL | 1 refills | Status: DC | PRN
Start: 2024-05-04 — End: 2024-07-30

## 2024-05-04 MED ORDER — ALBUTEROL SULFATE HFA 108 (90 BASE) MCG/ACT IN AERS: 1.0000 | INHALATION_SPRAY | RESPIRATORY_TRACT | 2 refills | Status: DC | PRN

## 2024-05-04 NOTE — Progress Notes (Signed)
 Established Patient Office Visit  Subjective   Patient ID: Brendan Ponce, male    DOB: 09-Nov-1981  Age: 43 y.o. MRN: [id number]  Chief Complaint  Patient presents with   Medical Management of Chronic Issues    HPI Brendan Ponce is here for a medication refill. He reports doing well overall with lexapro . Hasn't been taking hydroxyzine  recently as he has been out. Not sleeping well lately. He does not wish to change regimens however.   Continues to follow up with pain management and ortho.   Saw neurology for tremor. He will be seeing a movement specialist next month with [facility].   Hasn't had any asthma symptoms recently.      05/04/2024    2:06 PM 04/27/2023    9:24 AM 11/08/2022   11:56 AM  Depression screen PHQ 2/9  Decreased Interest 1 0 0  Down, Depressed, Hopeless 1 0 0  PHQ - 2 Score 2 0 0  Altered sleeping 2 1   Tired, decreased energy 1 0   Change in appetite 1 0   Feeling bad or failure about yourself  1 0   Trouble concentrating 0 0   Moving slowly or fidgety/restless 0 0   Suicidal thoughts 0 0   PHQ-9 Score 7 1   Difficult doing work/chores Not difficult at all Not difficult at all       05/04/2024    2:07 PM 04/27/2023    9:25 AM 05/27/2022    1:42 PM 04/15/2022   11:48 AM  GAD 7 : Generalized Anxiety Score  Nervous, Anxious, on Edge 1 0 1 2  Control/stop worrying 1 0 1 2  Worry too much - different things 1 0 1 2  Trouble relaxing 1 0 1 2  Restless 1 0 1 2  Easily annoyed or irritable 0 0 0 1  Afraid - awful might happen 1 0 1 2  Total GAD 7 Score 6 0 6 13  Anxiety Difficulty Not difficult at all Not difficult at all Somewhat difficult Somewhat difficult       ROS As per HPI.   Objective:     BP 112/69   Pulse 78   Temp (!) 97.1 F (36.2 C) (Temporal)   Ht 6\' 2"  (1.88 m)   Wt 209 lb 6.4 oz (95 kg)   SpO2 96%   BMI 26.89 kg/m    Physical Exam Vitals and nursing note reviewed.  Constitutional:      General: He is not in acute distress.     Appearance: Normal appearance. He is not ill-appearing, toxic-appearing or diaphoretic.  Cardiovascular:     Rate and Rhythm: Normal rate and regular rhythm.     Pulses: Normal pulses.     Heart sounds: Normal heart sounds. No murmur heard. Pulmonary:     Effort: Pulmonary effort is normal. No respiratory distress.     Breath sounds: Normal breath sounds.  Musculoskeletal:     Cervical back: No rigidity.  Skin:    General: Skin is warm and dry.  Neurological:     Mental Status: He is alert and oriented to person, place, and time. Mental status is at baseline.     Gait: Gait abnormal (using crutches).  Psychiatric:        Mood and Affect: Mood normal.        Behavior: Behavior normal.      No results found for any visits on 05/04/24.    The 10-year ASCVD risk score (Arnett  DK, et al., 2019) is: 2.4%    Assessment & Plan:   Brendan Ponce was seen today for medical management of chronic issues.  Diagnoses and all orders for this visit:  Generalized anxiety disorder Panic attacks Depression, major, single episode, mild (HCC) Stable. Denies SI. Continue lexapro , hydroxyzine  prn.  -     escitalopram  (LEXAPRO ) 20 MG tablet; Take 1 tablet (20 mg total) by mouth daily.  Mild persistent asthma without complication Well controlled on current regimen.  -     albuterol  (VENTOLIN  HFA) 108 (90 Base) MCG/ACT inhaler; Inhale 1-2 puffs into the lungs every 4 (four) hours as needed for wheezing or shortness of breath.  Chronic pain syndrome Complex regional pain syndrome i of right lower limb Managed by ortho, pain management.   Tremor Evaluated by neurology. Has appt with movement specialist upcoming.     Return in about 6 months (around 11/04/2024) for CPE with fasting labs.   The patient indicates understanding of these issues and agrees with the plan.  Albertha Huger, FNP

## 2024-05-30 DIAGNOSIS — G250 Essential tremor: Secondary | ICD-10-CM | POA: Diagnosis not present

## 2024-06-04 ENCOUNTER — Ambulatory Visit: Payer: Self-pay

## 2024-06-04 NOTE — Telephone Encounter (Signed)
 FYI Only or Action Required?: FYI only for provider  Patient was last seen in primary care on 05/04/2024 by Albertha Huger, FNP. Called Nurse Triage reporting Finger Injury. Symptoms began a week ago. Interventions attempted: OTC medications: cream. Symptoms are: unchanged.  Triage Disposition: See Physician Within 24 Hours  Patient/caregiver understands and will follow disposition?: Yes      Copied from CRM [id number]. Topic: Clinical - Red Word Triage >> Jun 04, 2024  3:53 PM Carlatta H wrote: [facility] that prompted transfer to Nurse Triage: Patient middle finger on left hand was smashed on a wood splitter//Bleeding and swelling// Reason for Disposition  Large swelling or bruise  Answer Assessment - Initial Assessment Questions 1. MECHANISM: "How did the injury happen?"      Smashed in wood splitter 2. ONSET: "When did the injury happen?" (Minutes or hours ago)      A week ago 3. LOCATION: "What part of the finger is injured?" "Is the nail damaged?"      Left middle finger 4. APPEARANCE of the INJURY: "What does the injury look like?"      Red, swollen 5. SEVERITY: "Can you use the hand normally?"  "Can you bend your fingers into a ball and then fully open them?"     yes 6. SIZE: For cuts, bruises, or swelling, ask: "How large is it?" (e.g., inches or centimeters;  entire finger)      swelling 7. PAIN: "Is there pain?" If Yes, ask: "How bad is the pain?"    (e.g., Scale 1-10; or mild, moderate, severe)  - NONE (0): no pain.  - MILD (1-3): doesn't interfere with normal activities.   - MODERATE (4-7): interferes with normal activities or awakens from sleep.  - SEVERE (8-10): excruciating pain, unable to hold a glass of water or bend finger even a little.     mild 8. TETANUS: For any breaks in the skin, ask: "When was the last tetanus booster?"     unknown 9. OTHER SYMPTOMS: "Do you have any other symptoms?"     no  Protocols used: Finger Injury-A-AH

## 2024-06-04 NOTE — Telephone Encounter (Signed)
 APPT MADE FOR TOMORROW

## 2024-06-05 ENCOUNTER — Ambulatory Visit (INDEPENDENT_AMBULATORY_CARE_PROVIDER_SITE_OTHER)

## 2024-06-05 ENCOUNTER — Encounter: Payer: Self-pay | Admitting: Nurse Practitioner

## 2024-06-05 ENCOUNTER — Ambulatory Visit (INDEPENDENT_AMBULATORY_CARE_PROVIDER_SITE_OTHER): Admitting: Nurse Practitioner

## 2024-06-05 VITALS — BP 104/59 | HR 92 | Temp 98.2°F | Ht 74.0 in | Wt 209.0 lb

## 2024-06-05 DIAGNOSIS — S6992XA Unspecified injury of left wrist, hand and finger(s), initial encounter: Secondary | ICD-10-CM | POA: Diagnosis not present

## 2024-06-05 MED ORDER — SULFAMETHOXAZOLE-TRIMETHOPRIM 800-160 MG PO TABS: 1.0000 | ORAL_TABLET | Freq: Two times a day (BID) | ORAL | 0 refills | Status: DC

## 2024-06-05 NOTE — Progress Notes (Signed)
   Subjective:    Patient ID: Brendan Ponce, male    DOB: [birth year], 43 y.o.   MRN: [id number]   Chief Complaint: Middle finger swollen (Left hand. Cut with wood splitter 1 week ago)   HPI  Patient cut his finger with a wood splitter 1 week ago. He did not see anyone about it. Has healed back over but hurts to bend it.  Patient Active Problem List   Diagnosis Date Noted   Mild persistent asthma without complication 01/15/2022   Generalized anxiety disorder 01/15/2022   Panic attacks 01/15/2022   Essential tremor 01/15/2022   Injury of right foot 01/15/2022   Microscopic hematuria 12/06/2016   Proteinuria 12/06/2016       Review of Systems  Musculoskeletal:  Positive for arthralgias (left middle finger).       Objective:   Physical Exam Constitutional:      Appearance: Normal appearance.  Cardiovascular:     Rate and Rhythm: Normal rate and regular rhythm.     Heart sounds: Normal heart sounds.  Pulmonary:     Breath sounds: Normal breath sounds.  Skin:    Comments: Healed laceration to left middle finger with erythema and tender to touch.  Neurological:     General: No focal deficit present.     Mental Status: He is alert and oriented to person, place, and time.  Psychiatric:        Mood and Affect: Mood normal.        Behavior: Behavior normal.    BP (!) 104/59   Pulse 92   Temp 98.2 F (36.8 C) (Temporal)   Ht 6\' 2"  (1.88 m)   Wt 209 lb (94.8 kg)   SpO2 95%   BMI 26.83 kg/m   Left middle finger xray- no foreign body or fracture visible-Preliminary reading by Irvine Mantis, FNP  [facility]        Assessment & Plan:  Brendan Ponce in today with chief complaint of Middle finger swollen (Left hand. Cut with wood splitter 1 week ago)   1. Injury of left middle finger, initial encounter (Primary) Antibiotics as prescribed Epsom salt soaks RTO prn - DG Finger Middle Left    The above assessment and management plan was discussed with the patient. The  patient verbalized understanding of and has agreed to the management plan. Patient is aware to call the clinic if symptoms persist or worsen. Patient is aware when to return to the clinic for a follow-up visit. Patient educated on when it is appropriate to go to the emergency department.   Mary-Margaret Gaylyn Keas, FNP

## 2024-06-07 ENCOUNTER — Ambulatory Visit: Payer: Self-pay | Admitting: Nurse Practitioner

## 2024-06-27 ENCOUNTER — Other Ambulatory Visit: Payer: Self-pay | Admitting: Family Medicine

## 2024-06-27 DIAGNOSIS — F410 Panic disorder [episodic paroxysmal anxiety] without agoraphobia: Secondary | ICD-10-CM

## 2024-07-29 ENCOUNTER — Other Ambulatory Visit: Payer: Self-pay | Admitting: Family Medicine

## 2024-07-29 DIAGNOSIS — F410 Panic disorder [episodic paroxysmal anxiety] without agoraphobia: Secondary | ICD-10-CM

## 2024-09-21 ENCOUNTER — Encounter: Payer: Self-pay | Admitting: Family Medicine

## 2024-09-24 ENCOUNTER — Encounter: Payer: Self-pay | Admitting: Family Medicine

## 2024-09-24 ENCOUNTER — Ambulatory Visit: Admitting: Family Medicine

## 2024-09-24 VITALS — BP 106/59 | HR 58 | Temp 97.7°F | Ht 74.0 in | Wt 222.2 lb

## 2024-09-24 DIAGNOSIS — L2084 Intrinsic (allergic) eczema: Secondary | ICD-10-CM

## 2024-09-24 DIAGNOSIS — F411 Generalized anxiety disorder: Secondary | ICD-10-CM

## 2024-09-24 DIAGNOSIS — Z1211 Encounter for screening for malignant neoplasm of colon: Secondary | ICD-10-CM

## 2024-09-24 DIAGNOSIS — K625 Hemorrhage of anus and rectum: Secondary | ICD-10-CM | POA: Diagnosis not present

## 2024-09-24 DIAGNOSIS — F320 Major depressive disorder, single episode, mild: Secondary | ICD-10-CM

## 2024-09-24 MED ORDER — TRIAMCINOLONE ACETONIDE 0.1 % EX CREA: 1.0000 | TOPICAL_CREAM | Freq: Two times a day (BID) | CUTANEOUS | 0 refills | Status: AC | PRN

## 2024-09-24 NOTE — Progress Notes (Signed)
 Established Patient Office Visit  Subjective   Patient ID: Brendan Ponce, male    DOB: 11/13/1981  Age: 43 y.o. MRN: [id number]  Chief Complaint  Patient presents with   Rectal Bleeding    HPI  History of Present Illness   Brendan Ponce is a 43 year old male who presents with bright red blood in bowel movements.  Rectal bleeding - Intermittent bright red blood with bowel movements for approximately one year - Bleeding is sporadic and not present with every bowel movement - Two recent episodes with significant bright red blood filling the toilet bowl, causing distress - No blood seen mixed in the stool itself - No melena - No associated abdominal pain, nausea, vomiting, or significant weight loss  Constipation - Uses pain medication, which is believed to contribute to constipation - Utilizes suppositories measures to manage constipation  Eczematous dermatitis - Chronic eczema localized to the neck - Previously used steroid cream with good effect, but no longer has access to it - Currently uses rubbing alcohol and hot water to manage pruritus - Not using any over-the-counter eczema creams regularly     09/24/2024   11:34 AM 06/05/2024   12:38 PM 05/04/2024    2:06 PM  Depression screen PHQ 2/9  Decreased Interest 0 0 1  Down, Depressed, Hopeless 0 0 1  PHQ - 2 Score 0 0 2  Altered sleeping 2  2  Tired, decreased energy 0  1  Change in appetite 0  1  Feeling bad or failure about yourself  0  1  Trouble concentrating 0  0  Moving slowly or fidgety/restless 0  0  Suicidal thoughts 0  0  PHQ-9 Score 2  7  Difficult doing work/chores Not difficult at all  Not difficult at all      09/24/2024   11:34 AM 06/05/2024   12:38 PM 05/04/2024    2:07 PM 04/27/2023    9:25 AM  GAD 7 : Generalized Anxiety Score  Nervous, Anxious, on Edge 0 0 1 0  Control/stop worrying 0 0 1 0  Worry too much - different things 0 0 1 0  Trouble relaxing 1 0 1 0  Restless 0 0 1 0  Easily annoyed  or irritable 0 0 0 0  Afraid - awful might happen 0 0 1 0  Total GAD 7 Score 1 0 6 0  Anxiety Difficulty Not difficult at all Not difficult at all Not difficult at all Not difficult at all           ROS As per HPI.    Objective:     BP (!) 106/59   Pulse (!) 58   Temp 97.7 F (36.5 C) (Temporal)   Ht 6' 2 (1.88 m)   Wt 222 lb 3.2 oz (100.8 kg)   SpO2 97%   BMI 28.53 kg/m  Wt Readings from Last 3 Encounters:  09/24/24 222 lb 3.2 oz (100.8 kg)  06/05/24 209 lb (94.8 kg)  05/04/24 209 lb 6.4 oz (95 kg)      Physical Exam Vitals and nursing note reviewed.  Constitutional:      General: He is not in acute distress.    Appearance: Normal appearance. He is not ill-appearing, toxic-appearing or diaphoretic.  Cardiovascular:     Rate and Rhythm: Normal rate and regular rhythm.     Pulses: Normal pulses.     Heart sounds: Normal heart sounds. No murmur heard. Pulmonary:     Effort:  Pulmonary effort is normal. No respiratory distress.     Breath sounds: Normal breath sounds.  Abdominal:     General: Bowel sounds are normal. There is no distension.     Palpations: Abdomen is soft.     Tenderness: There is no abdominal tenderness. There is no right CVA tenderness, left CVA tenderness, guarding or rebound.  Musculoskeletal:     Cervical back: No rigidity.  Skin:    General: Skin is warm and dry.  Neurological:     Mental Status: He is alert and oriented to person, place, and time. Mental status is at baseline.     Gait: Gait abnormal (using crutches).  Psychiatric:        Mood and Affect: Mood normal.        Behavior: Behavior normal.      No results found for any visits on 09/24/24.    The 10-year ASCVD risk score (Arnett DK, et al., 2019) is: 2.4%    Assessment & Plan:   Brendan Ponce was seen today for rectal bleeding.  Diagnoses and all orders for this visit:  Rectal bleeding -     Ambulatory referral to Gastroenterology  Colon cancer screening -      Ambulatory referral to Gastroenterology  Intrinsic eczema -     triamcinolone cream (KENALOG) 0.1 %; Apply 1 Application topically 2 (two) times daily as needed.  Depression, major, single episode, mild  Generalized anxiety disorder      Rectal bleeding Intermittent bright red rectal bleeding likely due to hemorrhoids, possibly internal, exacerbated by constipation. Differential includes hemorrhoids and colon cancer, though the latter is less likely given the absence of other symptoms. - Refer to GI for colonoscopy for further evaluation.  Eczema of neck Chronic eczema on the neck, previously managed with a steroid cream. Currently not using appropriate topical treatments and using methods that exacerbate the condition. - Prescribe steroid cream for flare-ups, to be used twice daily until improvement. - Advise against using rubbing alcohol and hot water on the skin. - Recommend using a thick emollient cream such as CeraVe, Nuceran, or Aquaphor daily, especially after showers. - Instruct to use a cool washcloth for immediate itch relief. - Advise on gentle skin care practices, including lukewarm showers and patting skin dry.  Depression and anxiety Well controlled with lexapro .    Return in about 6 months (around 03/24/2025) for CPE.  The patient indicates understanding of these issues and agrees with the plan.    Annabella CHRISTELLA Search, FNP

## 2024-09-24 NOTE — Telephone Encounter (Signed)
 Pt has appt in office today and this will be addressed.

## 2024-10-01 ENCOUNTER — Encounter: Payer: Self-pay | Admitting: Family Medicine

## 2024-10-08 ENCOUNTER — Ambulatory Visit: Admitting: Family Medicine

## 2024-10-25 DIAGNOSIS — K625 Hemorrhage of anus and rectum: Secondary | ICD-10-CM | POA: Diagnosis not present

## 2024-11-09 DIAGNOSIS — K625 Hemorrhage of anus and rectum: Secondary | ICD-10-CM | POA: Diagnosis not present

## 2024-11-09 DIAGNOSIS — G90521 Complex regional pain syndrome I of right lower limb: Secondary | ICD-10-CM | POA: Diagnosis not present

## 2024-11-09 DIAGNOSIS — K641 Second degree hemorrhoids: Secondary | ICD-10-CM | POA: Diagnosis not present

## 2024-11-09 DIAGNOSIS — F17210 Nicotine dependence, cigarettes, uncomplicated: Secondary | ICD-10-CM | POA: Diagnosis not present

## 2024-11-09 DIAGNOSIS — D123 Benign neoplasm of transverse colon: Secondary | ICD-10-CM | POA: Diagnosis not present

## 2024-11-09 DIAGNOSIS — G250 Essential tremor: Secondary | ICD-10-CM | POA: Diagnosis not present

## 2024-11-09 DIAGNOSIS — K624 Stenosis of anus and rectum: Secondary | ICD-10-CM | POA: Diagnosis not present

## 2024-11-09 DIAGNOSIS — D122 Benign neoplasm of ascending colon: Secondary | ICD-10-CM | POA: Diagnosis not present

## 2024-11-09 DIAGNOSIS — F17220 Nicotine dependence, chewing tobacco, uncomplicated: Secondary | ICD-10-CM | POA: Diagnosis not present

## 2024-11-09 DIAGNOSIS — Z79899 Other long term (current) drug therapy: Secondary | ICD-10-CM | POA: Diagnosis not present

## 2024-11-09 DIAGNOSIS — J45909 Unspecified asthma, uncomplicated: Secondary | ICD-10-CM | POA: Diagnosis not present

## 2024-11-09 DIAGNOSIS — K635 Polyp of colon: Secondary | ICD-10-CM | POA: Diagnosis not present

## 2024-11-09 DIAGNOSIS — F418 Other specified anxiety disorders: Secondary | ICD-10-CM | POA: Diagnosis not present

## 2024-11-09 LAB — HM COLONOSCOPY

## 2024-12-11 ENCOUNTER — Ambulatory Visit: Payer: Worker's Compensation | Attending: Orthopedic Surgery | Admitting: Physical Therapy

## 2024-12-11 DIAGNOSIS — M25571 Pain in right ankle and joints of right foot: Secondary | ICD-10-CM | POA: Diagnosis present

## 2024-12-11 DIAGNOSIS — R29818 Other symptoms and signs involving the nervous system: Secondary | ICD-10-CM

## 2024-12-11 DIAGNOSIS — M6281 Muscle weakness (generalized): Secondary | ICD-10-CM

## 2024-12-11 DIAGNOSIS — R29898 Other symptoms and signs involving the musculoskeletal system: Secondary | ICD-10-CM | POA: Insufficient documentation

## 2024-12-11 DIAGNOSIS — R2689 Other abnormalities of gait and mobility: Secondary | ICD-10-CM | POA: Insufficient documentation

## 2024-12-11 NOTE — Therapy (Signed)
 OUTPATIENT PHYSICAL THERAPY LOWER EXTREMITY EVALUATION   Patient Name: Brendan Ponce MRN: [id number] DOB:18-Jul-1981, 43 y.o., male Today's Date: 12/11/2024  END OF SESSION:  PT End of Session - 12/11/24 1022     Visit Number 1    Number of Visits 18    Date for Recertification  02/19/25    Authorization Type Worker's Comp    PT Start Time 1020    PT Stop Time 1100    PT Time Calculation (min) 40 min    Activity Tolerance Patient tolerated treatment well          Past Medical History:  Diagnosis Date   Anxiety    Asthma    Past Surgical History:  Procedure Laterality Date   FOOT SURGERY Right    Patient Active Problem List   Diagnosis Date Noted   Mild persistent asthma without complication 01/15/2022   Generalized anxiety disorder 01/15/2022   Panic attacks 01/15/2022   Essential tremor 01/15/2022   Injury of right foot 01/15/2022   Microscopic hematuria 12/06/2016   Proteinuria 12/06/2016    PCP: Joesph Annabella HERO, FNP  REFERRING PROVIDER: Daws, Murray NOVAK, MD  REFERRING DIAG: [phone] (ICD-10-CM) - History of foot surgery G90.521 (ICD-10-CM) - Complex regional pain syndrome i of right lower limb  THERAPY DIAG:  Muscle weakness (generalized)  Other abnormalities of gait and mobility  Other symptoms and signs involving the nervous system  Other symptoms and signs involving the musculoskeletal system  Pain in right ankle and joints of right foot  Rationale for Evaluation and Treatment: Rehabilitation  ONSET DATE: 3.5 years s/p Right foot crush injury with I&D, pinning of the fourth metatarsal, degloving injury plantar foot    SUBJECTIVE:   SUBJECTIVE STATEMENT: Just got exosym brace <1 month ago. Did 1 week of quick training with PT. Wanted him to continue to keep using brace. Brace has been helping with his step and his pain but still gets pain in the arch. Uses lofstrand crutches when not using brace but otherwise does use w/c in the house. Uses hiking  poles with the brace -- feels wobbly without any a/d. Has gone to [facility] in [place] and tried dry needling, and TENS initially for the CRPS. Reports he does have some ankle movement and pain mostly in the medial foot. Barely able to move toes. Did get email attachment from [facility] about the brace.   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 Yes  PRECAUTIONS: Fall  RED FLAGS: None   WEIGHT BEARING RESTRICTIONS: No  FALLS:  Has patient fallen in last 6 months? No  LIVING ENVIRONMENT: Lives with: lives with their spouse Lives in: House/apartment Stairs: 5-6 steps to get in the house but rest of the house is one level Has following equipment at home: Crutches, Wheelchair (manual), and hiking poles  OCCUPATION: Not working currently  PLOF: Independent with basic ADLs  PATIENT GOALS: Improve movement, return to recreational activities, improve strength, less difficulty with home activities  NEXT MD VISIT: in 6 weeks  OBJECTIVE:  Note: Objective measures were completed at Evaluation unless otherwise noted.  DIAGNOSTIC FINDINGS: Nothing recent  PATIENT SURVEYS:  LEFS  Extreme difficulty/unable (0), Quite a bit of difficulty (1), Moderate difficulty (2), Little difficulty (3), No difficulty (4) Survey date:  12/11/24  Any of your usual work, housework or school activities 1  2. Usual hobbies, recreational or sporting activities 1  3. Getting into/out of the  bath 2  4. Walking between rooms 1  5. Putting on socks/shoes 2  6. Squatting  1  7. Lifting an object, like a bag of groceries from the floor 1  8. Performing light activities around your home 1  9. Performing heavy activities around your home 0  10. Getting into/out of a car 1  11. Walking 2 blocks 0  12. Walking 1 mile 0  13. Going up/down 10 stairs (1 flight) 0  14. Standing for 1 hour 0  15.  sitting for 1 hour 4  16. Running on even ground 0   17. Running on uneven ground 0  18. Making sharp turns while running fast 0  19. Hopping  0  20. Rolling over in bed 4  Score total:  19     COGNITION: Overall cognitive status: Within functional limits for tasks assessed     SENSATION: Impaired -- sensitive to all pressure with increased pain  EDEMA:  None noted today  MUSCLE LENGTH: Did not assess  POSTURE: No Significant postural limitations  PALPATION: Did not test  LOWER EXTREMITY ROM: Limited R foot/ankle movement   LOWER EXTREMITY MMT:  MMT Right eval Left eval  Hip flexion 3+ 4  Hip extension 3 4-  Hip abduction 3 4-  Hip adduction    Hip internal rotation 4- 5  Hip external rotation 3+ 5  Knee flexion 5 5  Knee extension 4- 5  Ankle dorsiflexion    Ankle plantarflexion    Ankle inversion    Ankle eversion     (Blank rows = not tested)  LOWER EXTREMITY SPECIAL TESTS:  Did not assess  FUNCTIONAL TESTS:  Unable to perform tandem stance 1/2 tandem stance: 16.01 sec 30 sec sit to stand: 8 mostly towards L LE TUG: 15.3 sec, 14.43 sec, 14.44 sec; using UEs to push off chair and 2 hiking poles  GAIT: Distance walked: Into clinic Assistive device utilized: 2 hiking poles and exosym brace donned on L Level of assistance: Modified independence Comments: Antalgic, diminished heel strike and knee extension -- tends to amb with more a toe off pattern on R                                                                                                                                TREATMENT DATE: 12/11/24 Self care: see education below    PATIENT EDUCATION:  Education details: Exam findings, POC Person educated: Patient Education method: Medical Illustrator Education comprehension: verbalized understanding, returned demonstration, and needs further education  HOME EXERCISE PROGRAM: To be initiated  ASSESSMENT:  CLINICAL IMPRESSION: Patient is a 43 y.o. M who was seen today for  physical therapy evaluation and treatment for CRPS s/p foot surgery x 3.5 years. PMH significant for right foot crush injury with I&D, pinning of the fourth metatarsal, degloving injury plantar foot. Assessment is significant for gross R LE and core weakness resulting in decreased  balance/stability affecting gait and mobility with his Exosym brace. Has noted increased back pain when attempting to amb with his Exosym brace in a more correct movement. Pt will benefit from PT to improve on these deficits to maximize his level of function.  OBJECTIVE IMPAIRMENTS: Abnormal gait, decreased activity tolerance, decreased balance, decreased coordination, decreased endurance, decreased mobility, difficulty walking, decreased ROM, decreased strength, increased fascial restrictions, impaired sensation, improper body mechanics, postural dysfunction, and pain.   ACTIVITY LIMITATIONS: carrying, lifting, bending, standing, squatting, stairs, transfers, locomotion level, and caring for others  PARTICIPATION LIMITATIONS: cleaning, laundry, shopping, community activity, and yard work  PERSONAL FACTORS: Age, Fitness, Past/current experiences, and Time since onset of injury/illness/exacerbation are also affecting patient's functional outcome.   REHAB POTENTIAL: Good  CLINICAL DECISION MAKING: Evolving/moderate complexity  EVALUATION COMPLEXITY: Moderate   GOALS: Goals reviewed with patient? Yes  SHORT TERM GOALS: Target date: 01/08/2025  Pt will be ind with initial HEP Baseline: Goal status: INITIAL  2.  Pt will be able to perform tandem stance x 10 sec to demo increasing R LE weightbearing and stability Baseline:  Goal status: INITIAL  3.  Pt will be able to amb with only one walking aid for stability x 400' for home distances and normal reciprocal pattern Baseline:  Goal status: INITIAL  4.  Pt will be able to perform at least 15 sit to stands in 30 sec to demo increasing functional LE strength and  endurance Baseline:  Goal status: INITIAL    LONG TERM GOALS: Target date: 02/05/2025   Pt will be ind with management and progression of HEP Baseline:  Goal status: INITIAL  2.  Pt will be able to amb independently x1000' with LRAD Baseline:  Goal status: INITIAL  3.  Pt will have improved 30 sec sit to stand to at least 18 to be within his age norms (age norm of 5.2 with a 6.3 standard deviation) Baseline:  Goal status: INITIAL  4.  Pt will have improved TUG to </=10 sec with LRAD Baseline: 14.43 sec Goal status: INITIAL  5.  Pt will have improved tandem stance to at least 20 sec to demo increased standing stability and balance Baseline:  Goal status: INITIAL  6.  Pt will have improved LEFS to >/=31 Baseline:  Goal status: INITIAL   PLAN:  PT FREQUENCY: 18 visits per Worker's Comp  PT DURATION: 6-8 weeks  PLANNED INTERVENTIONS: [id number]- PT Re-evaluation, 97750- Physical Performance Testing, 97110-Therapeutic exercises, 97530- Therapeutic activity, [id number]- Neuromuscular re-education, 97535- Self Care, [id number]- Manual therapy, [id number]- Gait training, [phone]- Orthotic Initial, [phone]- Orthotic/Prosthetic subsequent, [id number]- Electrical stimulation (unattended), [id number]- Ultrasound, [id number] (1-2 muscles), [id number] (3+ muscles)- Dry Needling, Patient/Family education, Balance training, Stair training, Taping, Joint mobilization, Cryotherapy, and Moist heat  PLAN FOR NEXT SESSION: Initiate core, hip and knee strengthening. Work on standing postural stability and weight bearing.    Jillianna Stanek April Ma L Aland Chestnutt, PT, DPT 12/11/2024, 2:43 PM

## 2024-12-14 ENCOUNTER — Other Ambulatory Visit: Payer: Self-pay | Admitting: Nurse Practitioner

## 2024-12-14 ENCOUNTER — Other Ambulatory Visit: Payer: Self-pay | Admitting: Family Medicine

## 2024-12-14 DIAGNOSIS — L2084 Intrinsic (allergic) eczema: Secondary | ICD-10-CM

## 2024-12-17 ENCOUNTER — Encounter: Payer: Self-pay | Admitting: Physical Therapy

## 2024-12-17 ENCOUNTER — Ambulatory Visit: Payer: Worker's Compensation

## 2024-12-17 DIAGNOSIS — M6281 Muscle weakness (generalized): Secondary | ICD-10-CM | POA: Diagnosis not present

## 2024-12-17 DIAGNOSIS — R29818 Other symptoms and signs involving the nervous system: Secondary | ICD-10-CM

## 2024-12-17 DIAGNOSIS — M25571 Pain in right ankle and joints of right foot: Secondary | ICD-10-CM

## 2024-12-17 DIAGNOSIS — R29898 Other symptoms and signs involving the musculoskeletal system: Secondary | ICD-10-CM

## 2024-12-17 DIAGNOSIS — R2689 Other abnormalities of gait and mobility: Secondary | ICD-10-CM

## 2024-12-17 NOTE — Therapy (Signed)
 " OUTPATIENT PHYSICAL THERAPY LOWER EXTREMITY EVALUATION   Patient Name: Brendan Ponce MRN: [id number] DOB:[birth year], 43 y.o., male Today's Date: 12/17/2024  END OF SESSION:  PT End of Session - 12/17/24 1308     Visit Number 2    Number of Visits 18    Date for Recertification  02/19/25    Authorization Type Worker's Comp    PT Start Time 1300    PT Stop Time 1340    PT Time Calculation (min) 40 min    Activity Tolerance Patient tolerated treatment well          Past Medical History:  Diagnosis Date   Anxiety    Asthma    Past Surgical History:  Procedure Laterality Date   FOOT SURGERY Right    Patient Active Problem List   Diagnosis Date Noted   Mild persistent asthma without complication 01/15/2022   Generalized anxiety disorder 01/15/2022   Panic attacks 01/15/2022   Essential tremor 01/15/2022   Injury of right foot 01/15/2022   Microscopic hematuria 12/06/2016   Proteinuria 12/06/2016    PCP: Joesph Annabella HERO, FNP  REFERRING PROVIDER: Joesph Annabella HERO, FNP  REFERRING DIAG: [phone] (ICD-10-CM) - History of foot surgery G90.521 (ICD-10-CM) - Complex regional pain syndrome i of right lower limb  THERAPY DIAG:  Muscle weakness (generalized)  Other abnormalities of gait and mobility  Other symptoms and signs involving the nervous system  Other symptoms and signs involving the musculoskeletal system  Pain in right ankle and joints of right foot  Rationale for Evaluation and Treatment: Rehabilitation  ONSET DATE: 3.5 years s/p Right foot crush injury with I&D, pinning of the fourth metatarsal, degloving injury plantar foot    SUBJECTIVE:   SUBJECTIVE STATEMENT: Pt reports no new complaints today. Wants to get worker's comp to get him another pair of shoes that can fit his orthotic  From eval: Just got exosym brace <1 month ago. Did 1 week of quick training with PT. Wanted him to continue to keep using brace. Brace has been helping with his step  and his pain but still gets pain in the arch. Uses lofstrand crutches when not using brace but otherwise does use w/c in the house. Uses hiking poles with the brace -- feels wobbly without any a/d. Has gone to [facility] in [place] and tried dry needling, and TENS initially for the CRPS. Reports he does have some ankle movement and pain mostly in the medial foot. Barely able to move toes. Did get email attachment from [facility] about the brace.   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 Yes  PRECAUTIONS: Fall  RED FLAGS: None   WEIGHT BEARING RESTRICTIONS: No  FALLS:  Has patient fallen in last 6 months? No  LIVING ENVIRONMENT: Lives with: lives with their spouse Lives in: House/apartment Stairs: 5-6 steps to get in the house but rest of the house is one level Has following equipment at home: Crutches, Wheelchair (manual), and hiking poles  OCCUPATION: Not working currently  PLOF: Independent with basic ADLs  PATIENT GOALS: Improve movement, return to recreational activities, improve strength, less difficulty with home activities  NEXT MD VISIT: in 6 weeks  OBJECTIVE:  Note: Objective measures were completed at Evaluation unless otherwise noted.  DIAGNOSTIC FINDINGS: Nothing recent  PATIENT SURVEYS:  LEFS  Extreme difficulty/unable (0), Quite a bit of difficulty (1), Moderate difficulty (2), Little difficulty (3), No difficulty (4) Survey date:  12/11/24  Any of your usual work, housework or school activities 1  2. Usual hobbies, recreational or sporting activities 1  3. Getting into/out of the bath 2  4. Walking between rooms 1  5. Putting on socks/shoes 2  6. Squatting  1  7. Lifting an object, like a bag of groceries from the floor 1  8. Performing light activities around your home 1  9. Performing heavy activities around your home 0  10. Getting into/out of a car 1  11. Walking 2 blocks 0  12. Walking  1 mile 0  13. Going up/down 10 stairs (1 flight) 0  14. Standing for 1 hour 0  15.  sitting for 1 hour 4  16. Running on even ground 0  17. Running on uneven ground 0  18. Making sharp turns while running fast 0  19. Hopping  0  20. Rolling over in bed 4  Score total:  19     COGNITION: Overall cognitive status: Within functional limits for tasks assessed     SENSATION: Impaired -- sensitive to all pressure with increased pain  EDEMA:  None noted today  MUSCLE LENGTH: Did not assess  POSTURE: No Significant postural limitations  PALPATION: Did not test  LOWER EXTREMITY ROM: Limited R foot/ankle movement   LOWER EXTREMITY MMT:  MMT Right eval Left eval  Hip flexion 3+ 4  Hip extension 3 4-  Hip abduction 3 4-  Hip adduction    Hip internal rotation 4- 5  Hip external rotation 3+ 5  Knee flexion 5 5  Knee extension 4- 5  Ankle dorsiflexion    Ankle plantarflexion    Ankle inversion    Ankle eversion     (Blank rows = not tested)  LOWER EXTREMITY SPECIAL TESTS:  Did not assess  FUNCTIONAL TESTS:  Unable to perform tandem stance 1/2 tandem stance: 16.01 sec 30 sec sit to stand: 8 mostly towards L LE TUG: 15.3 sec, 14.43 sec, 14.44 sec; using UEs to push off chair and 2 hiking poles  GAIT: Distance walked: Into clinic Assistive device utilized: 2 hiking poles and exosym brace donned on L Level of assistance: Modified independence Comments: Antalgic, diminished heel strike and knee extension -- tends to amb with more a toe off pattern on R                                                                                                                                TREATMENT DATE:  12/17/24 Nustep L4 x 8 min LEs only Supine PPT x10 Supine bridge 2x10x5 Supine deadbug 10x5 with pball squeeze LTR x1 min Supine Piriformis stretch x30 Sidelying hip abduction 2 sets to fatigue Prone hip extension 2 sets to fatigue Modified downward dog with  alternating knee ext x5 attempted but too painful for pt Standing against wall terminal knee ext from small range knee flexion x10    12/11/24 Self care: see  education below    PATIENT EDUCATION:  Education details: Exam findings, POC Person educated: Patient Education method: Medical Illustrator Education comprehension: verbalized understanding, returned demonstration, and needs further education  HOME EXERCISE PROGRAM: Access Code: [email or web address] URL: [email or web address] Date: 12/17/2024 Prepared by: Kelechi Astarita April Earnie Starring  Exercises - Supine Bridge  - 1 x daily - 7 x weekly - 2 sets - 10 reps - Isometric Dead Bug  - 1 x daily - 7 x weekly - 10 reps - 5 sec hold - Beginner Side Leg Lift  - 1 x daily - 7 x weekly - 2 sets - 10 reps - Beginner Prone Single Leg Raise  - 1 x daily - 7 x weekly - 2 sets - 10 reps  ASSESSMENT:  CLINICAL IMPRESSION: Pt notes some increased R low back since using the exosym. Discussed with pt this is likely from his glute med and core weakness. Focused treatment on core and glute strengthening. Attempted to work on quads but unable to find a position that is comfortable/tolerable for pt's foot.   OBJECTIVE IMPAIRMENTS: Abnormal gait, decreased activity tolerance, decreased balance, decreased coordination, decreased endurance, decreased mobility, difficulty walking, decreased ROM, decreased strength, increased fascial restrictions, impaired sensation, improper body mechanics, postural dysfunction, and pain.   ACTIVITY LIMITATIONS: carrying, lifting, bending, standing, squatting, stairs, transfers, locomotion level, and caring for others  PARTICIPATION LIMITATIONS: cleaning, laundry, shopping, community activity, and yard work  PERSONAL FACTORS: Age, Fitness, Past/current experiences, and Time since onset of injury/illness/exacerbation are also affecting patient's functional outcome.   REHAB POTENTIAL: Good  CLINICAL DECISION  MAKING: Evolving/moderate complexity  EVALUATION COMPLEXITY: Moderate   GOALS: Goals reviewed with patient? Yes  SHORT TERM GOALS: Target date: 01/08/2025  Pt will be ind with initial HEP Baseline: Goal status: INITIAL  2.  Pt will be able to perform tandem stance x 10 sec to demo increasing R LE weightbearing and stability Baseline:  Goal status: INITIAL  3.  Pt will be able to amb with only one walking aid for stability x 400' for home distances and normal reciprocal pattern Baseline:  Goal status: INITIAL  4.  Pt will be able to perform at least 15 sit to stands in 30 sec to demo increasing functional LE strength and endurance Baseline:  Goal status: INITIAL    LONG TERM GOALS: Target date: 02/05/2025   Pt will be ind with management and progression of HEP Baseline:  Goal status: INITIAL  2.  Pt will be able to amb independently x1000' with LRAD Baseline:  Goal status: INITIAL  3.  Pt will have improved 30 sec sit to stand to at least 16 to be within his age norms (age norm of 28.2 with a 6.3 standard deviation) Baseline:  Goal status: INITIAL  4.  Pt will have improved TUG to </=10 sec with LRAD Baseline: 14.43 sec Goal status: INITIAL  5.  Pt will have improved tandem stance to at least 20 sec to demo increased standing stability and balance Baseline:  Goal status: INITIAL  6.  Pt will have improved LEFS to >/=31 Baseline:  Goal status: INITIAL   PLAN:  PT FREQUENCY: 18 visits per Worker's Comp  PT DURATION: 6-8 weeks  PLANNED INTERVENTIONS: [id number]- PT Re-evaluation, 97750- Physical Performance Testing, 97110-Therapeutic exercises, 97530- Therapeutic activity, [id number]- Neuromuscular re-education, 97535- Self Care, [id number]- Manual therapy, [id number]- Gait training, [id number]- Orthotic Initial, [id number]- Orthotic/Prosthetic subsequent, H9716- Electrical stimulation (unattended), [id number]- Ultrasound, 79439 (1-2 muscles), 20561 (3+ muscles)- Dry Needling, Patient/Family  education, Balance training, Stair training, Taping, Joint mobilization, Cryotherapy, and Moist heat  PLAN FOR NEXT SESSION: Initiate core, hip and knee strengthening. Work on standing postural stability and weight bearing. Reduce foot/ankle motion as this is counterintuitive to the use of exosym.    Korban Shearer April Ma L Shelise Maron, PT, DPT 12/17/2024, 1:55 PM  "

## 2024-12-23 ENCOUNTER — Observation Stay (HOSPITAL_COMMUNITY)
Admission: EM | Admit: 2024-12-23 | Discharge: 2024-12-26 | Disposition: A | Discharge status: Home / Self Care | Attending: Internal Medicine | Admitting: Internal Medicine

## 2024-12-23 ENCOUNTER — Emergency Department (HOSPITAL_COMMUNITY)

## 2024-12-23 ENCOUNTER — Encounter (HOSPITAL_COMMUNITY): Payer: Self-pay

## 2024-12-23 ENCOUNTER — Other Ambulatory Visit: Payer: Self-pay

## 2024-12-23 DIAGNOSIS — R0602 Shortness of breath: Secondary | ICD-10-CM | POA: Diagnosis present

## 2024-12-23 DIAGNOSIS — J9601 Acute respiratory failure with hypoxia: Secondary | ICD-10-CM | POA: Insufficient documentation

## 2024-12-23 DIAGNOSIS — G250 Essential tremor: Secondary | ICD-10-CM | POA: Insufficient documentation

## 2024-12-23 DIAGNOSIS — F410 Panic disorder [episodic paroxysmal anxiety] without agoraphobia: Secondary | ICD-10-CM

## 2024-12-23 DIAGNOSIS — R079 Chest pain, unspecified: Secondary | ICD-10-CM | POA: Diagnosis not present

## 2024-12-23 DIAGNOSIS — F411 Generalized anxiety disorder: Secondary | ICD-10-CM | POA: Insufficient documentation

## 2024-12-23 DIAGNOSIS — F320 Major depressive disorder, single episode, mild: Secondary | ICD-10-CM | POA: Insufficient documentation

## 2024-12-23 DIAGNOSIS — R931 Abnormal findings on diagnostic imaging of heart and coronary circulation: Secondary | ICD-10-CM | POA: Insufficient documentation

## 2024-12-23 DIAGNOSIS — R0989 Other specified symptoms and signs involving the circulatory and respiratory systems: Secondary | ICD-10-CM | POA: Diagnosis not present

## 2024-12-23 DIAGNOSIS — F17210 Nicotine dependence, cigarettes, uncomplicated: Secondary | ICD-10-CM | POA: Insufficient documentation

## 2024-12-23 DIAGNOSIS — J45901 Unspecified asthma with (acute) exacerbation: Secondary | ICD-10-CM | POA: Diagnosis not present

## 2024-12-23 LAB — BASIC METABOLIC PANEL WITH GFR
Anion gap: 15 (ref 5–15)
BUN: 9 mg/dL (ref 6–20)
CO2: 21 mmol/L — ABNORMAL LOW (ref 22–32)
Calcium: 9.3 mg/dL (ref 8.9–10.3)
Chloride: 104 mmol/L (ref 98–111)
Creatinine, Ser: 0.74 mg/dL (ref 0.61–1.24)
GFR, Estimated: >60 mL/min
Glucose, Bld: 98 mg/dL (ref 70–99)
Potassium: 4.1 mmol/L (ref 3.5–5.1)
Sodium: 140 mmol/L (ref 135–145)

## 2024-12-23 LAB — BLOOD GAS, VENOUS
Acid-Base Excess: 7.1 mmol/L — ABNORMAL HIGH (ref 0.0–2.0)
Bicarbonate: 32 mmol/L — ABNORMAL HIGH (ref 20.0–28.0)
Drawn by: 73776
O2 Saturation: 92.5 %
Patient temperature: 36.8
pCO2, Ven: 45 mmHg (ref 44–60)
pH, Ven: 7.46 — ABNORMAL HIGH (ref 7.25–7.43)
pO2, Ven: 61 mmHg — ABNORMAL HIGH (ref 32–45)

## 2024-12-23 LAB — CBC
HCT: 44.4 % (ref 39.0–52.0)
Hemoglobin: 15.7 g/dL (ref 13.0–17.0)
MCH: 32.5 pg (ref 26.0–34.0)
MCHC: 35.4 g/dL (ref 30.0–36.0)
MCV: 91.9 fL (ref 80.0–100.0)
Platelets: 241 K/uL (ref 150–400)
RBC: 4.83 MIL/uL (ref 4.22–5.81)
RDW: 12.3 % (ref 11.5–15.5)
WBC: 5.3 K/uL (ref 4.0–10.5)
nRBC: 0 % (ref 0.0–0.2)

## 2024-12-23 LAB — RESP PANEL BY RT-PCR (RSV, FLU A&B, COVID)  RVPGX2
Influenza A by PCR: NEGATIVE
Influenza B by PCR: NEGATIVE
Resp Syncytial Virus by PCR: NEGATIVE
SARS Coronavirus 2 by RT PCR: NEGATIVE

## 2024-12-23 LAB — D-DIMER, QUANTITATIVE: D-Dimer, Quant: 0.28 ug{FEU}/mL (ref 0.00–0.50)

## 2024-12-23 LAB — TROPONIN T, HIGH SENSITIVITY: Troponin T High Sensitivity: <15 ng/L (ref 0–19)

## 2024-12-23 MED ORDER — PREGABALIN 75 MG PO CAPS
150.0000 mg | ORAL_CAPSULE | Freq: Two times a day (BID) | ORAL | Status: DC
  Administered 2024-12-23 – 2024-12-26 (×6): 150 mg via ORAL
  Filled 2024-12-23: qty 2

## 2024-12-23 MED ORDER — ESCITALOPRAM OXALATE 10 MG PO TABS
20.0000 mg | ORAL_TABLET | Freq: Every day | ORAL | Status: DC
  Administered 2024-12-23 – 2024-12-26 (×4): 20 mg via ORAL
  Filled 2024-12-23: qty 2

## 2024-12-23 MED ORDER — ALBUTEROL SULFATE (2.5 MG/3ML) 0.083% IN NEBU
2.5000 mg | INHALATION_SOLUTION | Freq: Three times a day (TID) | RESPIRATORY_TRACT | Status: AC
  Administered 2024-12-24 (×3): 2.5 mg via RESPIRATORY_TRACT

## 2024-12-23 MED ORDER — IPRATROPIUM-ALBUTEROL 0.5-2.5 (3) MG/3ML IN SOLN
3.0000 mL | Freq: Once | RESPIRATORY_TRACT | Status: AC
  Administered 2024-12-23: 3 mL via RESPIRATORY_TRACT
  Filled 2024-12-23: qty 3

## 2024-12-23 MED ORDER — TAPENTADOL HCL 75 MG PO TABS: 75.0000 mg | ORAL_TABLET | Freq: Three times a day (TID) | ORAL | Status: DC | PRN

## 2024-12-23 MED ORDER — ALBUTEROL SULFATE (2.5 MG/3ML) 0.083% IN NEBU
2.5000 mg | INHALATION_SOLUTION | Freq: Three times a day (TID) | RESPIRATORY_TRACT | Status: DC
  Administered 2024-12-23: 2.5 mg via RESPIRATORY_TRACT
  Filled 2024-12-23: qty 3

## 2024-12-23 MED ORDER — ALBUTEROL SULFATE (2.5 MG/3ML) 0.083% IN NEBU
2.5000 mg | INHALATION_SOLUTION | RESPIRATORY_TRACT | Status: DC | PRN
  Administered 2024-12-24 – 2024-12-26 (×5): 2.5 mg via RESPIRATORY_TRACT

## 2024-12-23 MED ORDER — METHYLPREDNISOLONE SODIUM SUCC 125 MG IJ SOLR
60.0000 mg | Freq: Two times a day (BID) | INTRAMUSCULAR | Status: DC
  Administered 2024-12-24 – 2024-12-25 (×3): 60 mg via INTRAVENOUS

## 2024-12-23 MED ORDER — PROPRANOLOL HCL 20 MG PO TABS
60.0000 mg | ORAL_TABLET | Freq: Three times a day (TID) | ORAL | Status: DC
  Administered 2024-12-23 – 2024-12-26 (×5): 60 mg via ORAL
  Filled 2024-12-23: qty 3

## 2024-12-23 MED ORDER — POLYETHYLENE GLYCOL 3350 17 G PO PACK: 17.0000 g | PACK | Freq: Every day | ORAL | Status: DC | PRN

## 2024-12-23 MED ORDER — ONDANSETRON HCL 4 MG PO TABS: 4.0000 mg | ORAL_TABLET | Freq: Four times a day (QID) | ORAL | Status: DC | PRN

## 2024-12-23 MED ORDER — MAGNESIUM SULFATE 2 GM/50ML IV SOLN
2.0000 g | Freq: Once | INTRAVENOUS | Status: AC
  Administered 2024-12-23: 2 g via INTRAVENOUS
  Filled 2024-12-23: qty 50

## 2024-12-23 MED ORDER — GUAIFENESIN-DM 100-10 MG/5ML PO SYRP
15.0000 mL | ORAL_SOLUTION | Freq: Three times a day (TID) | ORAL | Status: AC
  Administered 2024-12-23 – 2024-12-24 (×3): 15 mL via ORAL
  Filled 2024-12-23: qty 15

## 2024-12-23 MED ORDER — METHYLPREDNISOLONE SODIUM SUCC 125 MG IJ SOLR
125.0000 mg | Freq: Once | INTRAMUSCULAR | Status: AC
  Administered 2024-12-23: 125 mg via INTRAVENOUS
  Filled 2024-12-23: qty 2

## 2024-12-23 MED ORDER — ENOXAPARIN SODIUM 40 MG/0.4ML IJ SOSY
40.0000 mg | PREFILLED_SYRINGE | INTRAMUSCULAR | Status: DC
  Administered 2024-12-23 – 2024-12-25 (×3): 40 mg via SUBCUTANEOUS
  Filled 2024-12-23: qty 0.4

## 2024-12-23 MED ORDER — ONDANSETRON HCL 4 MG/2ML IJ SOLN: 4.0000 mg | Freq: Four times a day (QID) | INTRAMUSCULAR | Status: DC | PRN

## 2024-12-23 MED ORDER — ACETAMINOPHEN 325 MG PO TABS
650.0000 mg | ORAL_TABLET | Freq: Four times a day (QID) | ORAL | Status: DC | PRN
  Administered 2024-12-23 – 2024-12-24 (×2): 650 mg via ORAL
  Filled 2024-12-23: qty 2

## 2024-12-23 MED ORDER — ACETAMINOPHEN 650 MG RE SUPP: 650.0000 mg | Freq: Four times a day (QID) | RECTAL | Status: DC | PRN

## 2024-12-23 MED ORDER — ASPIRIN 81 MG PO TBEC
81.0000 mg | DELAYED_RELEASE_TABLET | Freq: Every evening | ORAL | Status: DC
  Administered 2024-12-23 – 2024-12-25 (×3): 81 mg via ORAL
  Filled 2024-12-23: qty 1

## 2024-12-23 MED ORDER — HYDROXYZINE HCL 25 MG PO TABS: 25.0000 mg | ORAL_TABLET | Freq: Three times a day (TID) | ORAL | Status: DC | PRN

## 2024-12-23 MED ORDER — HYDROCODONE-ACETAMINOPHEN 5-325 MG PO TABS
1.0000 | ORAL_TABLET | Freq: Four times a day (QID) | ORAL | Status: DC | PRN
  Administered 2024-12-23 – 2024-12-26 (×3): 1 via ORAL
  Filled 2024-12-23: qty 1

## 2024-12-23 NOTE — ED Notes (Signed)
 Neb treatment stopped per RT. Sat 94% room air.

## 2024-12-23 NOTE — H&P (Signed)
 " History and Physical    Brendan Ponce FMW:[id number] DOB: Mar 03, 1981 DOA: 12/23/2024  PCP: Joesph Annabella HERO, FNP   Patient coming from: Home  I have personally briefly reviewed patient's old medical records in [facility]  Chief Complaint: Difficulty breathing  HPI: Brendan Ponce is a 43 y.o. male with medical history significant for asthma, anxiety, tobacco abuse. Patient presented to the ED with complaints of difficulty breathing that started yesterday.  He also reports cough, wheezing, and chest pain.  Chest pain is mid chest, worse with deep breathing and coughing.  No lower extremity swelling.  Reports typically at this time of the year he has asthma flares, but typically his home inhalers controlled.  ED Course: Temperature 98.1.  Heart rate 55-62.  Respiratory rate 14-20.  Blood pressure systolic 118-126.  O2 sats 82% on room air, with ambulation dropped to 87%, placed on 2 L. D-dimer 0.28. Troponin <15. VBG shows pH of 7.46, pCO2 of 45. Chest x-ray negative for acute abnormality. EKG shows sinus rhythm. Multiple DuoNebs given, Solu-Medrol  125 mg x 1 given, magnesium  2 g given.  With hypoxia on ambulation hospitalist called to admit.  Review of Systems: As per HPI all other systems reviewed and negative.  Past Medical History:  Diagnosis Date   Anxiety    Asthma     Past Surgical History:  Procedure Laterality Date   FOOT SURGERY Right      reports that he has been smoking cigarettes. His smokeless tobacco use includes chew. He reports that he does not drink alcohol and does not use drugs.  Allergies[1]  Family History  Problem Relation Age of Onset   Cancer Mother        pancreatic   Cancer Father        kidney   Asthma Daughter    Asthma Son     Prior to Admission medications  Medication Sig Start Date End Date Taking? Authorizing Provider  albuterol  (VENTOLIN  HFA) 108 (90 Base) MCG/ACT inhaler Inhale 1-2 puffs into the lungs every 4 (four) hours as  needed for wheezing or shortness of breath. 05/04/24   Joesph Annabella HERO, FNP  escitalopram  (LEXAPRO ) 20 MG tablet Take 1 tablet (20 mg total) by mouth daily. 05/04/24   Joesph Annabella HERO, FNP  hydrOXYzine  (ATARAX ) 25 MG tablet TAKE 1 TABLET BY MOUTH 3 TIMES DAILY AS NEEDED FOR ANXIETY. 07/30/24   Joesph Annabella HERO, FNP  ibuprofen (ADVIL) 800 MG tablet Take 800 mg by mouth every 8 (eight) hours as needed. 12/22/21   [provider]  Multiple Vitamin (MULTIVITAMIN PO) Take by mouth.    [provider]  NUCYNTA  75 MG tablet Take 75 mg by mouth 3 (three) times daily as needed.    [provider]  pregabalin  (LYRICA ) 150 MG capsule Take 150 mg by mouth 2 (two) times daily. 03/26/24   [provider]  propranolol  (INDERAL ) 20 MG tablet Take 60 mg by mouth. 09/12/24 09/12/25  [provider]  tiZANidine (ZANAFLEX) 4 MG capsule Take 4 mg by mouth 3 (three) times daily. 08/03/21   [provider]  triamcinolone  cream (KENALOG ) 0.1 % APPLY 1 APPLICATION TOPICALLY TWICE A DAY AS NEEDED 12/14/24   Joesph Annabella HERO, FNP    Physical Exam: Vitals:   12/23/24 1523 12/23/24 1526 12/23/24 1545 12/23/24 1630  BP:   120/87 (!) 119/96  Pulse: (!) 59 (!) 55 (!) 57 61  Resp: 19 15 14 18   Temp:  TempSrc:      SpO2: (!) 89% 91% 94% 91%  Weight:      Height:        Constitutional: NAD, calm, comfortable Vitals:   12/23/24 1523 12/23/24 1526 12/23/24 1545 12/23/24 1630  BP:   120/87 (!) 119/96  Pulse: (!) 59 (!) 55 (!) 57 61  Resp: 19 15 14 18   Temp:      TempSrc:      SpO2: (!) 89% 91% 94% 91%  Weight:      Height:       Eyes: PERRL, lids and conjunctivae normal ENMT: Mucous membranes are moist. Posterior pharynx clear of any exudate or lesions.Normal dentition.  Neck: normal, supple, no masses, no thyromegaly Respiratory: clear to auscultation bilaterally, no wheezing, crackles present- right base. Normal respiratory effort. No accessory muscle use.   Cardiovascular: Regular rate and rhythm, no murmurs / rubs / gallops. No extremity edema.  Extremities warm. Abdomen: no tenderness, no masses palpated. No hepatosplenomegaly. Bowel sounds positive.  Musculoskeletal: no clubbing / cyanosis. No joint deformity upper and lower extremities.  Reports crush injury to right lower extremity years ago and has since ambulated with crutches. Skin: no rashes, lesions, ulcers. No induration Neurologic: No facial asymmetry, moves extremity spontaneously, speech fluent. Psychiatric: Normal judgment and insight. Alert and oriented x 3. Normal mood.   Labs on Admission: I have personally reviewed following labs and imaging studies  CBC: Recent Labs  Lab 12/23/24 1318  WBC 5.3  HGB 15.7  HCT 44.4  MCV 91.9  PLT 241   Basic Metabolic Panel: Recent Labs  Lab 12/23/24 1318  NA 140  K 4.1  CL 104  CO2 21*  GLUCOSE 98  BUN 9  CREATININE 0.74  CALCIUM 9.3   Urine analysis:    Component Value Date/Time   APPEARANCEUR Clear 11/08/2022 1153   GLUCOSEU Negative 11/08/2022 1153   BILIRUBINUR Negative 11/08/2022 1153   PROTEINUR Negative 11/08/2022 1153   NITRITE Negative 11/08/2022 1153   LEUKOCYTESUR Negative 11/08/2022 1153    Radiological Exams on Admission: DG Chest 2 View Result Date: 12/23/2024 CLINICAL DATA:  Shortness of breath and chest pain x1 day. EXAM: CHEST - 2 VIEW COMPARISON:  August 25, 2023 FINDINGS: The heart size and mediastinal contours are within normal limits. Low lung volumes are noted. Both lungs are clear. The visualized skeletal structures are unremarkable. IMPRESSION: No active cardiopulmonary disease. Electronically Signed   By: Suzen Dials M.D.   On: 12/23/2024 14:39    EKG: Independently reviewed.  Sinus rhythm, rate 56, QTc 404.  Mild ST elevation likely repol abnormalities.  Similar to prior EKG.    Assessment/Plan Principal Problem:   Asthma exacerbation   Assessment and Plan:  Asthma  exacerbation with acute hypoxic respiratory failure-O2 sats dropped to 87% on room air with ambulation.  No wheezing on my evaluation, he status post several rounds of DuoNebs.  Chest x-ray clear.  COVID influenza RSV negative. - Albuterol  nebs as needed and scheduled - 2 g mag given in ED - IV Solu-Medrol  125 mg x 1 given, continue 60 twice daily - Mucolytics, supplemental O2  Chest pain- likely musculoskeletal.  Troponin < 15.  EKG with baseline ST repol abnormalities. - Trend troponin  Anxiety -Resume Lexapro   DVT prophylaxis: Lovenox  Code Status: Full code Family Communication: None at bedside Disposition Plan: ~ 1 - 2 days Consults called: none Admission status:  obs tele   Author: Tully FORBES Carwin, MD 12/23/2024 5:39 PM  For on call review [email or web address].     [1] No Known Allergies  "

## 2024-12-23 NOTE — ED Triage Notes (Addendum)
 Pt with a hx of asthma and tobacco abuse presents with ShOB and CP that started yesterday. He has used his albuterol  MDI 6-7 times in the last 24 hours and a couple nebulizer treatments with minimal relief. He has an occasional cough with thick clear sputum. Denies fever or chills. The CP is located mid-sternal and described as sharp and made worse with deep inspiration and reproducible with palpation. Pt has sick contacts.

## 2024-12-23 NOTE — ED Notes (Signed)
Placed on 2L of O2  

## 2024-12-23 NOTE — ED Provider Notes (Signed)
 " [place] EMERGENCY DEPARTMENT AT [facility] Provider Note   CSN: [id number] Arrival date & time: 12/23/24  1144     Patient presents with: Shortness of Breath (/)   Brendan Ponce is a 43 y.o. male with a history of asthma who presents with 2 days of asthma exacerbation symptoms including shortness of breath, chest pain, wheezing, and cough.  Patient states that he has tried his albuterol  inhaler and nebulizer treatments at home without significant relief.  Patient states that he usually has asthma flareups around this time of the year however he has never had to seek care at the hospital as his home albuterol  is normally sufficient. He denies any fever, chills, myalgias, headache, nasal congestion, and sore throat. The patient endorses wax/wane pleuritic chest pain and some mild dyspnea at rest.  He denies any hemoptysis, syncope, confusion, neck stiffness, or focal neurological deficits.  There are no known recent hospitalizations, patient has never been intubated for same. The patient is in no acute distress.     Shortness of Breath      Prior to Admission medications  Medication Sig Start Date End Date Taking? Authorizing Provider  albuterol  (VENTOLIN  HFA) 108 (90 Base) MCG/ACT inhaler Inhale 1-2 puffs into the lungs every 4 (four) hours as needed for wheezing or shortness of breath. 05/04/24  Yes Joesph Annabella HERO, FNP  aspirin  EC 81 MG tablet Take 81 mg by mouth every evening. Swallow whole.   Yes [provider]  escitalopram  (LEXAPRO ) 20 MG tablet Take 1 tablet (20 mg total) by mouth daily. 05/04/24  Yes Joesph Annabella HERO, FNP  hydrOXYzine  (ATARAX ) 25 MG tablet TAKE 1 TABLET BY MOUTH 3 TIMES DAILY AS NEEDED FOR ANXIETY. 07/30/24  Yes Joesph Annabella HERO, FNP  Multiple Vitamin (MULTIVITAMIN PO) Take 1 tablet by mouth daily.   Yes [provider]  NUCYNTA  75 MG tablet Take 75 mg by mouth 3 (three) times daily as needed.   Yes [provider]   pregabalin  (LYRICA ) 150 MG capsule Take 150 mg by mouth 2 (two) times daily. 03/26/24  Yes [provider]  propranolol  (INDERAL ) 20 MG tablet Take 60 mg by mouth 3 (three) times daily. 09/12/24 09/12/25 Yes [provider]  tiZANidine (ZANAFLEX) 4 MG capsule Take 4 mg by mouth 3 (three) times daily. 08/03/21  Yes [provider]  triamcinolone  cream (KENALOG ) 0.1 % APPLY 1 APPLICATION TOPICALLY TWICE A DAY AS NEEDED 12/14/24  Yes Joesph Annabella HERO, FNP  gabapentin (NEURONTIN) 800 MG tablet Take 800 mg by mouth 3 (three) times daily. Patient not taking: Reported on 12/23/2024 12/14/24   [provider]    Allergies: Patient has no known allergies.    Review of Systems  Respiratory:  Positive for shortness of breath.     Updated Vital Signs BP 115/79 (BP Location: Right Arm)   Pulse 61   Temp 98.2 F (36.8 C) (Oral)   Resp 16   Ht 6' 2 (1.88 m)   Wt 97.6 kg   SpO2 91%   BMI 27.62 kg/m   Physical Exam Vitals and nursing note reviewed.  Constitutional:      General: He is awake. He is not in acute distress.    Appearance: Normal appearance. He is not toxic-appearing.  HENT:     Head: Normocephalic and atraumatic.     Nose: No congestion.     Mouth/Throat:     Mouth: Mucous membranes are moist.  Eyes:  General: Lids are normal. Vision grossly intact.     Extraocular Movements: Extraocular movements intact.     Conjunctiva/sclera: Conjunctivae normal.     Pupils: Pupils are equal, round, and reactive to light.  Cardiovascular:     Rate and Rhythm: Normal rate and regular rhythm.     Pulses:          Radial pulses are 2+ on the right side.  Pulmonary:     Effort: Pulmonary effort is normal. No accessory muscle usage, prolonged expiration or respiratory distress.     Breath sounds: No stridor. Examination of the right-upper field reveals wheezing. Examination of the left-upper field reveals wheezing. Wheezing present.     Comments:  Wheezing auscultated bilaterally in upper lobes.  Patient has no difficulty speaking in complete sentences.  Chest:     Chest wall: Tenderness present.     Comments: Pain on midsternal palpation Abdominal:     General: Abdomen is flat.     Palpations: Abdomen is soft.     Tenderness: There is no abdominal tenderness.  Musculoskeletal:     Cervical back: Full passive range of motion without pain and neck supple. No rigidity. No spinous process tenderness.  Lymphadenopathy:     Cervical: Cervical adenopathy present.  Skin:    General: Skin is warm and dry.     Capillary Refill: Capillary refill takes less than 2 seconds.     Findings: No rash.  Neurological:     General: No focal deficit present.     Mental Status: He is alert.  Psychiatric:        Attention and Perception: Attention normal.        Mood and Affect: Mood normal.        Speech: Speech normal.     (all labs ordered are listed, but only abnormal results are displayed) Labs Reviewed  BASIC METABOLIC PANEL WITH GFR - Abnormal; Notable for the following components:      Result Value   CO2 21 (*)    All other components within normal limits  BLOOD GAS, VENOUS - Abnormal; Notable for the following components:   pH, Ven 7.46 (*)    pO2, Ven 61 (*)    Bicarbonate 32.0 (*)    Acid-Base Excess 7.1 (*)    All other components within normal limits  RESP PANEL BY RT-PCR (RSV, FLU A&B, COVID)  RVPGX2  CBC  D-DIMER, QUANTITATIVE  HIV ANTIBODY (ROUTINE TESTING W REFLEX)  TROPONIN T, HIGH SENSITIVITY  TROPONIN T, HIGH SENSITIVITY    EKG: EKG Interpretation Date/Time:  Sunday December 23 2024 13:18:31 EST Ventricular Rate:  56 PR Interval:  184 QRS Duration:  101 QT Interval:  418 QTC Calculation: 404 R Axis:   53  Text Interpretation: Sinus rhythm ST elev, probable normal early repol pattern No significant change since last tracing Confirmed by Yolande Charleston [phone]) on 12/23/2024 2:18:16 PM  Radiology: DG  Chest 2 View Result Date: 12/23/2024 CLINICAL DATA:  Shortness of breath and chest pain x1 day. EXAM: CHEST - 2 VIEW COMPARISON:  August 25, 2023 FINDINGS: The heart size and mediastinal contours are within normal limits. Low lung volumes are noted. Both lungs are clear. The visualized skeletal structures are unremarkable. IMPRESSION: No active cardiopulmonary disease. Electronically Signed   By: Suzen Dials M.D.   On: 12/23/2024 14:39     Procedures   Medications Ordered in the ED  aspirin  EC tablet 81 mg (81 mg Oral Given 12/23/24 1913)  propranolol  (INDERAL ) tablet 60 mg (has no administration in time range)  escitalopram  (LEXAPRO ) tablet 20 mg (20 mg Oral Given 12/23/24 1913)  hydrOXYzine  (ATARAX ) tablet 25 mg (has no administration in time range)  pregabalin  (LYRICA ) capsule 150 mg (has no administration in time range)  methylPREDNISolone  sodium succinate (SOLU-MEDROL ) 125 mg/2 mL injection 60 mg (has no administration in time range)  guaiFENesin -dextromethorphan  (ROBITUSSIN DM) 100-10 MG/5ML syrup 15 mL (has no administration in time range)  enoxaparin  (LOVENOX ) injection 40 mg (40 mg Subcutaneous Given 12/23/24 1912)  acetaminophen  (TYLENOL ) tablet 650 mg (has no administration in time range)    Or  acetaminophen  (TYLENOL ) suppository 650 mg (has no administration in time range)  ondansetron  (ZOFRAN ) tablet 4 mg (has no administration in time range)    Or  ondansetron  (ZOFRAN ) injection 4 mg (has no administration in time range)  polyethylene glycol (MIRALAX  / GLYCOLAX ) packet 17 g (has no administration in time range)  albuterol  (PROVENTIL ) (2.5 MG/3ML) 0.083% nebulizer solution 2.5 mg (has no administration in time range)  albuterol  (PROVENTIL ) (2.5 MG/3ML) 0.083% nebulizer solution 2.5 mg (has no administration in time range)  tapentadol  HCl (NUCYNTA ) tablet 75 mg (has no administration in time range)  methylPREDNISolone  sodium succinate (SOLU-MEDROL ) 125 mg/2 mL injection  125 mg (125 mg Intravenous Given 12/23/24 1430)  ipratropium-albuterol  (DUONEB) 0.5-2.5 (3) MG/3ML nebulizer solution 3 mL (3 mLs Nebulization Given 12/23/24 1444)  ipratropium-albuterol  (DUONEB) 0.5-2.5 (3) MG/3ML nebulizer solution 3 mL (3 mLs Nebulization Given 12/23/24 1443)  ipratropium-albuterol  (DUONEB) 0.5-2.5 (3) MG/3ML nebulizer solution 3 mL (3 mLs Nebulization Given 12/23/24 1443)  magnesium  sulfate IVPB 2 g 50 mL (2 g Intravenous New Bag/Given 12/23/24 1626)                                 Medical Decision Making Amount and/or Complexity of Data Reviewed Labs: ordered. Decision-making details documented in ED Course. Radiology: ordered. Decision-making details documented in ED Course.  Risk Prescription drug management. Decision regarding hospitalization.   Patient presents to the ED for: Asthma exacerbation This involves an extensive number of treatment options  Differential diagnosis includes:  Infectious etiology Chronic exacerbation  Co-morbid conditions: Asthma  Additional history/records obtained and reviewed: Additional history obtained from  outside medical records External records from outside source obtained and reviewed including current medication regimen for home asthma treatment.  Clinical Course as of 12/23/24 2121  Sun Dec 23, 2024  1312 Temp: 98.1 F (36.7 C) Afebrile, vital stable, [ML]  1339 CBC WNL  [ML]  1355 Troponin T, High Sensitivity Negative [ML]  1355 Basic metabolic panel(!) No acute findings [ML]  1543 CBC WNL [ML]  1544 D-dimer, quantitative Negative  [ML]  1545 Resp panel by RT-PCR (RSV, Flu A&B, Covid) Anterior Nasal Swab Negative [ML]  1545 DG Chest 2 View No acute findings [ML]  1609 Patient given duoneb x3 and Solu-Medrol  - mild relief, still desatting to 88% when ambulatory. [ML]  1641 Patient given magnesium  sulfate 2g for symptomatic relief - placed on 2 L nasal cannula due to desatting to 88% now at rest [ML]  1642  Hospitalist to admit [ML]    Clinical Course User Index [ML] Willma Duwaine CROME, PA    Data Reviewed / Actions Taken: Labs ordered/reviewed with my independent interpretation in ED course above. Imaging ordered/reviewed with my independent interpretation in ED course above. I agree with the radiologists interpretation.  EKG ordered/reviewed with my independent  interpretation in ED course above. The patient was kept on continuous cardiac monitoring during the ED stay. Key findings for the patient were reviewed with the attending physician, and ongoing clinical collaboration was maintained throughout the visit  Management / Treatments: See ED course above for medications, treatments administered, and clinical rationale.   Reevaluation of the patient after these medicines showed that the patient had minimal improvement. I have reviewed the patients home medicines and have made adjustments as needed  ED Course / Reassessments: Problem List: Asthma exacerbation 43 year old male presented for asthma exacerbation. Initial assessment included history, physical exam, and review of prior medical records. Laboratory testing was obtained given clinical presentation and patient was found to be hypocapnic.  There is low clinical suspicion for bacterial etiology requiring antibiotics at this time given afebrile state and reassuring laboratory values. Imaging was not indicative of any underlying infectious or cardiopulmonary process.  Troponin, D-dimer, and EKG were all unremarkable making cardiac involvement less likely.  The patient was treated symptomatically with methylprednisolone , magnesium  sulfate, and multiple DuoNeb treatments with minimal relief.  Patient was ambulated throughout the ED with a notable drop in oxygen saturation and increase work of breathing - patient was then placed and kept on 2lpm [place] oxygen. Given findings of asthma exacerbation with new oxygen requirement and little symptomatic relief  with ED treatment regimen-patient to be admitted for further evaluation and care. Serial reassessments performed: Yes    Consultations:  Hospitalist - Emokpae MD Consult recommendations incorporated into plan: agreed to admit for further evaluation and care  Disposition: Disposition: Admission Rationale for disposition: Asthma exacerbation, new oxygen requirement, little symptomatic relief with ED management. The disposition plan and rationale were discussed with the patient at the bedside, all questions were addressed, and the patient demonstrated understanding.  This note was produced using Electronics Engineer. While I have reviewed and verified all clinical information, transcription errors may remain.      Final diagnoses:  Generalized anxiety disorder  Depression, major, single episode, mild  Panic attacks    ED Discharge Orders     None          Willma Duwaine CROME, [place] 12/23/24 2129  "

## 2024-12-24 ENCOUNTER — Observation Stay (HOSPITAL_COMMUNITY)

## 2024-12-24 ENCOUNTER — Other Ambulatory Visit (HOSPITAL_COMMUNITY): Payer: Self-pay | Admitting: *Deleted

## 2024-12-24 DIAGNOSIS — R0609 Other forms of dyspnea: Secondary | ICD-10-CM

## 2024-12-24 LAB — ECHOCARDIOGRAM COMPLETE
AR max vel: 3.64 cm2
AV Area VTI: 3.73 cm2
AV Area mean vel: 3.69 cm2
AV Mean grad: 5 mmHg
AV Peak grad: 8.2 mmHg
Ao pk vel: 1.43 m/s
Area-P 1/2: 2.87 cm2
Calc EF: 68.5 %
Height: 74 in
S' Lateral: 2.9 cm
Single Plane A2C EF: 73.4 %
Single Plane A4C EF: 63.3 %
Weight: 3441.6 [oz_av]

## 2024-12-24 LAB — HIV ANTIBODY (ROUTINE TESTING W REFLEX): HIV Screen 4th Generation wRfx: NONREACTIVE

## 2024-12-24 LAB — TROPONIN T, HIGH SENSITIVITY: Troponin T High Sensitivity: <15 ng/L (ref 0–19)

## 2024-12-24 MED ORDER — BISACODYL 10 MG RE SUPP
10.0000 mg | Freq: Every day | RECTAL | Status: DC | PRN
  Administered 2024-12-24 – 2024-12-25 (×2): 10 mg via RECTAL
  Filled 2024-12-24 (×2): qty 1

## 2024-12-24 MED ORDER — TAPENTADOL HCL ER 50 MG PO TB12
100.0000 mg | ORAL_TABLET | Freq: Two times a day (BID) | ORAL | Status: DC | PRN
  Administered 2024-12-24 – 2024-12-25 (×3): 100 mg via ORAL
  Filled 2024-12-24 (×3): qty 2

## 2024-12-24 NOTE — Progress Notes (Signed)
 Inpatient Care Manager [facility]) has reviewed patient and no ICM needs have been identified at this time. However, IPCM team will continue to follow along and monitor patient advancement through interdisciplinary progression rounds. If new patient transition needs arise, please enter a ICM consult to prompt IPCM team to follow up.     12/24/24 1140  TOC Brief Assessment  Insurance and Status Reviewed  Patient has primary care physician Yes  Home environment has been reviewed Home with Spouse  Prior level of function: Independent  Prior/Current Home Services No current home services  Social Drivers of Health Review SDOH reviewed no interventions necessary  Readmission risk has been reviewed Yes  Transition of care needs no transition of care needs at this time

## 2024-12-24 NOTE — Plan of Care (Signed)

## 2024-12-24 NOTE — Progress Notes (Signed)
 " PROGRESS NOTE    Brendan Ponce  FMW:[id number] DOB: [birth year] DOA: 12/23/2024 PCP: Joesph Annabella HERO, FNP   Brief Narrative:   Brendan Ponce is a 43 y.o. male with medical history significant for asthma, anxiety, tobacco abuse. Patient presented to the ED with complaints of difficulty breathing that started yesterday.  He also reports cough, wheezing, and chest pain.  Chest pain is mid chest, worse with deep breathing and coughing.  No lower extremity swelling.  Reports typically at this time of the year he has asthma flares, but typically his home inhalers controlled.  In the ER patient was satting 82% on room air, with ambulation dropped to 87%, placed on 2 L/min oxygen.  Further workup includes D-dimer to 0.28, negative troponins, chest x-ray negative, EKG with sinus rhythm.  Patient wants given DuoNebs multiple times, Solu-Medrol , magnesium .  Admitted for further management  Assessment & Plan:   Principal Problem:   Asthma exacerbation Active Problems:   Generalized anxiety disorder   Acute respiratory failure with hypoxia (HCC)    Asthma exacerbation with acute hypoxic respiratory failure-O2 sats dropped to 87% on room air with ambulation.    COVID influenza RSV negative. Chest x-ray clear Patient on 2 L/min supplemental oxygen -Chest exam is clear without wheezing -Subjectively feels better. -Will plan to wean off oxygen today and ambulate patient.  If successful, will discharge today -Few days of prednisone   Chest pain- likely musculoskeletal.  Troponin < 15.  EKG with baseline ST repol abnormalities. - Troponin negative x 2  Anxiety -Resume Lexapro    DVT prophylaxis: Lovenox  Code Status: Full code Family Communication: None at bedside Disposition Plan: Likely discharge   consults called: none Admission status:  obs tele   Subjective:  Seen and examined at the bedside.  Says he feels better and hopes to go home today.  Still on supplemental oxygen 2 L/min.  He  said he felt little winded when walking to the bathroom last night  Objective: Vitals:   12/23/24 2151 12/24/24 0140 12/24/24 0351 12/24/24 0610  BP:  119/69 118/72   Pulse:      Resp:      Temp:  98 F (36.7 C) 98.6 F (37 C)   TempSrc:  Oral Oral   SpO2: 97% 92% 93% 96%  Weight:      Height:       No intake or output data in the 24 hours ending 12/24/24 1008 Filed Weights   12/23/24 1251 12/23/24 1723  Weight: 95.3 kg 97.6 kg    Examination:  General exam: Appears calm and comfortable  Respiratory system: No wheezing or crackles.   Cardiovascular system: S1 & S2 heard, Rate controlled Gastrointestinal system: Abdomen is nondistended, soft and nontender. Normal bowel sounds heard. Extremities: No cyanosis, clubbing, edema  Central nervous system: Alert and oriented. No focal neurological deficits. Moving extremities Skin: No rashes, lesions or ulcers Psychiatry: Judgement and insight appear normal. Mood & affect appropriate.     Data Reviewed: I have personally reviewed following labs and imaging studies  CBC: Recent Labs  Lab 12/23/24 1318  WBC 5.3  HGB 15.7  HCT 44.4  MCV 91.9  PLT 241   Basic Metabolic Panel: Recent Labs  Lab 12/23/24 1318  NA 140  K 4.1  CL 104  CO2 21*  GLUCOSE 98  BUN 9  CREATININE 0.74  CALCIUM 9.3   GFR: Estimated Creatinine Clearance: 135.6 mL/min (by C-G formula based on SCr of 0.74 mg/dL). Liver Function Tests:  No results for input(s): AST, ALT, ALKPHOS, BILITOT, PROT, ALBUMIN in the last 168 hours. No results for input(s): LIPASE, AMYLASE in the last 168 hours. No results for input(s): AMMONIA in the last 168 hours. Coagulation Profile: No results for input(s): INR, PROTIME in the last 168 hours. Cardiac Enzymes: No results for input(s): CKTOTAL, CKMB, CKMBINDEX, TROPONINI in the last 168 hours. BNP (last 3 results) No results for input(s): PROBNP in the last 8760 hours. HbA1C: No  results for input(s): HGBA1C in the last 72 hours. CBG: No results for input(s): GLUCAP in the last 168 hours. Lipid Profile: No results for input(s): CHOL, HDL, LDLCALC, TRIG, CHOLHDL, LDLDIRECT in the last 72 hours. Thyroid Function Tests: No results for input(s): TSH, T4TOTAL, FREET4, T3FREE, THYROIDAB in the last 72 hours. Anemia Panel: No results for input(s): VITAMINB12, FOLATE, FERRITIN, TIBC, IRON, RETICCTPCT in the last 72 hours. Sepsis Labs: No results for input(s): PROCALCITON, LATICACIDVEN in the last 168 hours.  Recent Results (from the past 240 hours)  Resp panel by RT-PCR (RSV, Flu A&B, Covid) Anterior Nasal Swab     Status: None   Collection Time: 12/23/24 12:57 PM   Specimen: Anterior Nasal Swab  Result Value Ref Range Status   SARS Coronavirus 2 by RT PCR NEGATIVE NEGATIVE Final    Comment: (NOTE) SARS-CoV-2 target nucleic acids are NOT DETECTED.  The SARS-CoV-2 RNA is generally detectable in upper respiratory specimens during the acute phase of infection. The lowest concentration of SARS-CoV-2 viral copies this assay can detect is 138 copies/mL. A negative result does not preclude SARS-Cov-2 infection and should not be used as the sole basis for treatment or other patient management decisions. A negative result may occur with  improper specimen collection/handling, submission of specimen other than nasopharyngeal swab, presence of viral mutation(s) within the areas targeted by this assay, and inadequate number of viral copies(<138 copies/mL). A negative result must be combined with clinical observations, patient history, and epidemiological information. The expected result is Negative.  Fact Sheet for Patients:  bloggercourse.com  Fact Sheet for Healthcare Providers:  seriousbroker.it  This test is no t yet approved or cleared by the [place]  FDA and  has been  authorized for detection and/or diagnosis of SARS-CoV-2 by FDA under an Emergency Use Authorization (EUA). This EUA will remain  in effect (meaning this test can be used) for the duration of the COVID-19 declaration under Section 564(b)(1) of the Act, 21 U.S.C.section 360bbb-3(b)(1), unless the authorization is terminated  or revoked sooner.       Influenza A by PCR NEGATIVE NEGATIVE Final   Influenza B by PCR NEGATIVE NEGATIVE Final    Comment: (NOTE) The Xpert Xpress SARS-CoV-2/FLU/RSV plus assay is intended as an aid in the diagnosis of influenza from Nasopharyngeal swab specimens and should not be used as a sole basis for treatment. Nasal washings and aspirates are unacceptable for Xpert Xpress SARS-CoV-2/FLU/RSV testing.  Fact Sheet for Patients: bloggercourse.com  Fact Sheet for Healthcare Providers: seriousbroker.it  This test is not yet approved or cleared by the [place]  FDA and has been authorized for detection and/or diagnosis of SARS-CoV-2 by FDA under an Emergency Use Authorization (EUA). This EUA will remain in effect (meaning this test can be used) for the duration of the COVID-19 declaration under Section 564(b)(1) of the Act, 21 U.S.C. section 360bbb-3(b)(1), unless the authorization is terminated or revoked.     Resp Syncytial Virus by PCR NEGATIVE NEGATIVE Final    Comment: (NOTE) Fact Sheet for  Patients: bloggercourse.com  Fact Sheet for Healthcare Providers: seriousbroker.it  This test is not yet approved or cleared by the [place]  FDA and has been authorized for detection and/or diagnosis of SARS-CoV-2 by FDA under an Emergency Use Authorization (EUA). This EUA will remain in effect (meaning this test can be used) for the duration of the COVID-19 declaration under Section 564(b)(1) of the Act, 21 U.S.C. section 360bbb-3(b)(1), unless the  authorization is terminated or revoked.  Performed at [facility], [address] [place], [place] [address]          Radiology Studies: DG Chest 2 View Result Date: 12/23/2024 CLINICAL DATA:  Shortness of breath and chest pain x1 day. EXAM: CHEST - 2 VIEW COMPARISON:  August 25, 2023 FINDINGS: The heart size and mediastinal contours are within normal limits. Low lung volumes are noted. Both lungs are clear. The visualized skeletal structures are unremarkable. IMPRESSION: No active cardiopulmonary disease. Electronically Signed   By: Suzen Dials M.D.   On: 12/23/2024 14:39        Scheduled Meds:  albuterol   2.5 mg Nebulization TID   aspirin  EC  81 mg Oral QPM   enoxaparin  (LOVENOX ) injection  40 mg Subcutaneous Q24H   escitalopram   20 mg Oral Daily   guaiFENesin -dextromethorphan   15 mL Oral Q8H   methylPREDNISolone  (SOLU-MEDROL ) injection  60 mg Intravenous Q12H   pregabalin   150 mg Oral BID   propranolol   60 mg Oral TID   Continuous Infusions:        Derryl Duval, MD [facility] 12/24/2024, 10:08 AM   "

## 2024-12-24 NOTE — Progress Notes (Signed)
*  PRELIMINARY RESULTS* Echocardiogram 2D Echocardiogram has been performed.  Teresa Aida PARAS 12/24/2024, 3:51 PM

## 2024-12-25 ENCOUNTER — Other Ambulatory Visit (HOSPITAL_COMMUNITY): Payer: Self-pay | Admitting: *Deleted

## 2024-12-25 ENCOUNTER — Observation Stay (HOSPITAL_COMMUNITY)

## 2024-12-25 DIAGNOSIS — J45901 Unspecified asthma with (acute) exacerbation: Secondary | ICD-10-CM | POA: Diagnosis not present

## 2024-12-25 DIAGNOSIS — J984 Other disorders of lung: Secondary | ICD-10-CM | POA: Diagnosis not present

## 2024-12-25 DIAGNOSIS — R0602 Shortness of breath: Secondary | ICD-10-CM | POA: Diagnosis not present

## 2024-12-25 MED ORDER — SODIUM CHLORIDE 0.9 % IV SOLN: INTRAVENOUS | Status: DC

## 2024-12-25 MED ORDER — METHYLPREDNISOLONE SODIUM SUCC 40 MG IJ SOLR
40.0000 mg | Freq: Two times a day (BID) | INTRAMUSCULAR | Status: DC
  Administered 2024-12-25 – 2024-12-26 (×2): 40 mg via INTRAVENOUS
  Filled 2024-12-25 (×2): qty 1

## 2024-12-25 MED ORDER — ALBUTEROL SULFATE (2.5 MG/3ML) 0.083% IN NEBU
2.5000 mg | INHALATION_SOLUTION | Freq: Two times a day (BID) | RESPIRATORY_TRACT | Status: DC
  Administered 2024-12-25 – 2024-12-26 (×3): 2.5 mg via RESPIRATORY_TRACT
  Filled 2024-12-25 (×3): qty 3

## 2024-12-25 NOTE — Plan of Care (Signed)

## 2024-12-25 NOTE — Progress Notes (Signed)
" ° °  [facility] has been requested to perform a transesophageal echocardiogram on Brendan Ponce for right atrium echodensity.     The patient does NOT have any absolute or relative contraindications to a Transesophageal Echocardiogram (TEE).  The patient has: Current Oxygen Requirement . Will notify hospitalist to wean off O2 prior to procedure to assess baseline O2 response.     After careful review of history and examination, the risks and benefits of transesophageal echocardiogram have been explained including risks of esophageal damage, perforation (1:10,000 risk), bleeding, pharyngeal hematoma as well as other potential complications associated with conscious sedation including aspiration, arrhythmia, respiratory failure and death. Alternatives to treatment were discussed, questions were answered. Patient is willing to proceed.   Signed, Lorette CINDERELLA Kapur, PA-C  12/25/2024 11:24 AM   "

## 2024-12-25 NOTE — Progress Notes (Signed)
 Preop orders for upcoming TEE released by accident. Provider notified- other orders held and will be given pre-cath tomorrow.

## 2024-12-25 NOTE — Progress Notes (Addendum)
 " PROGRESS NOTE    Brendan Ponce  FMW:[id number] DOB: February 24, 1981 DOA: 12/23/2024 PCP: Joesph Annabella HERO, FNP   Brief Narrative:   Brendan Ponce is a 43 y.o. male with medical history significant for asthma, anxiety, tobacco abuse. Patient presented to the ED with complaints of difficulty breathing that started yesterday.  He also reports cough, wheezing, and chest pain.  Chest pain is mid chest, worse with deep breathing and coughing.  No lower extremity swelling.  Reports typically at this time of the year he has asthma flares, but typically his home inhalers controlled.  In the ER patient was satting 82% on room air, with ambulation dropped to 87%, placed on 2 L/min oxygen.  Further workup includes D-dimer to 0.28, negative troponins, chest x-ray negative, EKG with sinus rhythm.  Patient wants given DuoNebs multiple times, Solu-Medrol , magnesium .  Admitted for further management  Assessment & Plan:   Principal Problem:   Asthma exacerbation Active Problems:   Generalized anxiety disorder   Acute respiratory failure with hypoxia (HCC)    Asthma exacerbation with acute hypoxic respiratory failure-O2 sats dropped to 87% on room air with ambulation.    COVID influenza RSV negative. Echocardiogram shows normal left ventricular function.  Mitral valve mobile lesion. Chest x-ray clear Patient on 2 L/min supplemental oxygen -Chest exam is clear without wheezing.  Some crackles today, will order for repeat chest x-ray -Subjectively feels better. -Will plan to wean off oxygen today and ambulate patient.   -Reduce IV Solu-Medrol  to 40 twice daily -Consider few days of prednisone on discharge.  Chest pain- likely musculoskeletal.  Troponin < 15.  EKG with baseline ST repol abnormalities. - Troponin negative x 2 - Echo with mitral valve echodensity.  Will consult cardiology/?  TEE  Anxiety -Resume Lexapro   Essential tremors: Continue propranolol .  Patient has however not taken past  couple of days due to respiratory symptoms.   DVT prophylaxis: Lovenox  Code Status: Full code Family Communication: None at bedside Disposition Plan: Likely discharge   consults called: none Admission status:  obs tele   Subjective:  Seen and examined at the bedside.  Feels slightly better than yesterday.  He still on supplemental oxygen.  Was short of breath when walked to the bathroom yesterday.  Still having coughing, feels like the mucus has loosened up.  No fever.  Not at baseline yet  Objective: Vitals:   12/24/24 1359 12/24/24 1925 12/24/24 1959 12/25/24 0315  BP:   108/69 112/68  Pulse:   76 66  Resp:      Temp:   97.7 F (36.5 C) 97.7 F (36.5 C)  TempSrc:   Oral Oral  SpO2: 94% 93% 93% 91%  Weight:      Height:        Intake/Output Summary (Last 24 hours) at 12/25/2024 1022 Last data filed at 12/25/2024 0919 Gross per 24 hour  Intake 240 ml  Output --  Net 240 ml   Filed Weights   12/23/24 1251 12/23/24 1723  Weight: 95.3 kg 97.6 kg    Examination:  General exam: Appears calm and comfortable  Respiratory system: Crackles on the left upper zone, no wheezing Cardiovascular system: S1 & S2 heard, Rate controlled Gastrointestinal system: Abdomen is nondistended, soft and nontender. Normal bowel sounds heard. Extremities: No cyanosis, clubbing, edema  Central nervous system: Alert and oriented. No focal neurological deficits. Moving extremities Skin: No rashes, lesions or ulcers Psychiatry: Judgement and insight appear normal. Mood & affect appropriate.  Data Reviewed: I have personally reviewed following labs and imaging studies  CBC: Recent Labs  Lab 12/23/24 1318  WBC 5.3  HGB 15.7  HCT 44.4  MCV 91.9  PLT 241   Basic Metabolic Panel: Recent Labs  Lab 12/23/24 1318  NA 140  K 4.1  CL 104  CO2 21*  GLUCOSE 98  BUN 9  CREATININE 0.74  CALCIUM 9.3   GFR: Estimated Creatinine Clearance: 135.6 mL/min (by C-G formula based on SCr of  0.74 mg/dL). Liver Function Tests: No results for input(s): AST, ALT, ALKPHOS, BILITOT, PROT, ALBUMIN in the last 168 hours. No results for input(s): LIPASE, AMYLASE in the last 168 hours. No results for input(s): AMMONIA in the last 168 hours. Coagulation Profile: No results for input(s): INR, PROTIME in the last 168 hours. Cardiac Enzymes: No results for input(s): CKTOTAL, CKMB, CKMBINDEX, TROPONINI in the last 168 hours. BNP (last 3 results) No results for input(s): PROBNP in the last 8760 hours. HbA1C: No results for input(s): HGBA1C in the last 72 hours. CBG: No results for input(s): GLUCAP in the last 168 hours. Lipid Profile: No results for input(s): CHOL, HDL, LDLCALC, TRIG, CHOLHDL, LDLDIRECT in the last 72 hours. Thyroid Function Tests: No results for input(s): TSH, T4TOTAL, FREET4, T3FREE, THYROIDAB in the last 72 hours. Anemia Panel: No results for input(s): VITAMINB12, FOLATE, FERRITIN, TIBC, IRON, RETICCTPCT in the last 72 hours. Sepsis Labs: No results for input(s): PROCALCITON, LATICACIDVEN in the last 168 hours.  Recent Results (from the past 240 hours)  Resp panel by RT-PCR (RSV, Flu A&B, Covid) Anterior Nasal Swab     Status: None   Collection Time: 12/23/24 12:57 PM   Specimen: Anterior Nasal Swab  Result Value Ref Range Status   SARS Coronavirus 2 by RT PCR NEGATIVE NEGATIVE Final    Comment: (NOTE) SARS-CoV-2 target nucleic acids are NOT DETECTED.  The SARS-CoV-2 RNA is generally detectable in upper respiratory specimens during the acute phase of infection. The lowest concentration of SARS-CoV-2 viral copies this assay can detect is 138 copies/mL. A negative result does not preclude SARS-Cov-2 infection and should not be used as the sole basis for treatment or other patient management decisions. A negative result may occur with  improper specimen collection/handling, submission of  specimen other than nasopharyngeal swab, presence of viral mutation(s) within the areas targeted by this assay, and inadequate number of viral copies(<138 copies/mL). A negative result must be combined with clinical observations, patient history, and epidemiological information. The expected result is Negative.  Fact Sheet for Patients:  bloggercourse.com  Fact Sheet for Healthcare Providers:  seriousbroker.it  This test is no t yet approved or cleared by the [place]  FDA and  has been authorized for detection and/or diagnosis of SARS-CoV-2 by FDA under an Emergency Use Authorization (EUA). This EUA will remain  in effect (meaning this test can be used) for the duration of the COVID-19 declaration under Section 564(b)(1) of the Act, 21 U.S.C.section 360bbb-3(b)(1), unless the authorization is terminated  or revoked sooner.       Influenza A by PCR NEGATIVE NEGATIVE Final   Influenza B by PCR NEGATIVE NEGATIVE Final    Comment: (NOTE) The Xpert Xpress SARS-CoV-2/FLU/RSV plus assay is intended as an aid in the diagnosis of influenza from Nasopharyngeal swab specimens and should not be used as a sole basis for treatment. Nasal washings and aspirates are unacceptable for Xpert Xpress SARS-CoV-2/FLU/RSV testing.  Fact Sheet for Patients: bloggercourse.com  Fact Sheet for Healthcare Providers:  seriousbroker.it  This test is not yet approved or cleared by the [place]  FDA and has been authorized for detection and/or diagnosis of SARS-CoV-2 by FDA under an Emergency Use Authorization (EUA). This EUA will remain in effect (meaning this test can be used) for the duration of the COVID-19 declaration under Section 564(b)(1) of the Act, 21 U.S.C. section 360bbb-3(b)(1), unless the authorization is terminated or revoked.     Resp Syncytial Virus by PCR NEGATIVE NEGATIVE Final     Comment: (NOTE) Fact Sheet for Patients: bloggercourse.com  Fact Sheet for Healthcare Providers: seriousbroker.it  This test is not yet approved or cleared by the [place]  FDA and has been authorized for detection and/or diagnosis of SARS-CoV-2 by FDA under an Emergency Use Authorization (EUA). This EUA will remain in effect (meaning this test can be used) for the duration of the COVID-19 declaration under Section 564(b)(1) of the Act, 21 U.S.C. section 360bbb-3(b)(1), unless the authorization is terminated or revoked.  Performed at [facility], [address], [place], [place] [address]          Radiology Studies: ECHOCARDIOGRAM COMPLETE Result Date: 12/24/2024    ECHOCARDIOGRAM REPORT   Patient Name:   Brendan Ponce Date of Exam: 12/24/2024 Medical Rec #:  [id number]     Height:       74.0 in Accession #:    [id number]    Weight:       215.1 lb Date of Birth:  1981/08/18      BSA:          2.242 m Patient Age:    43 years      BP:           118/72 mmHg Patient Gender: M             HR:           68 bpm. Exam Location:  [facility] Procedure: 2D Echo, Cardiac Doppler and Color Doppler (Both Spectral and Color            Flow Doppler were utilized during procedure). Indications:    Dyspnea R06.00  History:        Patient has no prior history of Echocardiogram examinations. Hx                 of asthma and tobacco abuse.  Sonographer:    Aida Pizza RCS Referring Phys: [id number] Danon Lograsso IMPRESSIONS  1. Left ventricular ejection fraction, by estimation, is 60 to 65%. The left ventricle has normal function. The left ventricle has no regional wall motion abnormalities. Left ventricular diastolic parameters were normal.  2. Right ventricular systolic function is normal. The right ventricular size is normal. Tricuspid regurgitation signal is inadequate for assessing PA pressure.  3. There is an oval shaped mobile echodensity, measuring  1.2 x 0.49 cm, located in the RA free wall. Could be normal variant. Consider TEE to r/o thrombus and mass.  4. The mitral valve is normal in structure. No evidence of mitral valve regurgitation. No evidence of mitral stenosis.  5. The aortic valve is tricuspid. Aortic valve regurgitation is not visualized. No aortic stenosis is present.  6. Aortic dilatation noted. There is mild dilatation of the aortic root, measuring 40 mm.  7. The inferior vena cava is normal in size with greater than 50% respiratory variability, suggesting right atrial pressure of 3 mmHg. Comparison(s): No prior Echocardiogram. FINDINGS  Left Ventricle: Left ventricular ejection fraction, by estimation, is 60 to 65%. The  left ventricle has normal function. The left ventricle has no regional wall motion abnormalities. Strain was performed and the global longitudinal strain is indeterminate. The left ventricular internal cavity size was normal in size. There is no left ventricular hypertrophy. Left ventricular diastolic parameters were normal. Right Ventricle: The right ventricular size is normal. No increase in right ventricular wall thickness. Right ventricular systolic function is normal. Tricuspid regurgitation signal is inadequate for assessing PA pressure. Left Atrium: Left atrial size was normal in size. Right Atrium: There is an oval shaped mobile echodensity, measuring 1.2 x 0.49 cm, located in the RA free wall. Could be normal variant. Consider TEE to r/o thrombus and mass. Right atrial size was normal in size. Pericardium: There is no evidence of pericardial effusion. Mitral Valve: The mitral valve is normal in structure. No evidence of mitral valve regurgitation. No evidence of mitral valve stenosis. Tricuspid Valve: The tricuspid valve is normal in structure. Tricuspid valve regurgitation is trivial. No evidence of tricuspid stenosis. Aortic Valve: The aortic valve is tricuspid. Aortic valve regurgitation is not visualized. No aortic  stenosis is present. Aortic valve mean gradient measures 5.0 mmHg. Aortic valve peak gradient measures 8.2 mmHg. Aortic valve area, by VTI measures 3.73 cm. Pulmonic Valve: The pulmonic valve was normal in structure. Pulmonic valve regurgitation is trivial. No evidence of pulmonic stenosis. Aorta: Aortic dilatation noted. There is mild dilatation of the aortic root, measuring 40 mm. Venous: The inferior vena cava is normal in size with greater than 50% respiratory variability, suggesting right atrial pressure of 3 mmHg. IAS/Shunts: No atrial level shunt detected by color flow Doppler. Additional Comments: 3D was performed not requiring image post processing on an independent workstation and was indeterminate.  LEFT VENTRICLE PLAX 2D LVIDd:         5.40 cm      Diastology LVIDs:         2.90 cm      LV e' medial:    6.84 cm/s LV PW:         1.00 cm      LV E/e' medial:  11.5 LV IVS:        1.00 cm      LV e' lateral:   9.57 cm/s LVOT diam:     2.20 cm      LV E/e' lateral: 8.3 LV SV:         105 LV SV Index:   47 LVOT Area:     3.80 cm  LV Volumes (MOD) LV vol d, MOD A2C: 109.0 ml LV vol d, MOD A4C: 155.0 ml LV vol s, MOD A2C: 29.0 ml LV vol s, MOD A4C: 56.9 ml LV SV MOD A2C:     80.0 ml LV SV MOD A4C:     155.0 ml LV SV MOD BP:      89.1 ml RIGHT VENTRICLE RV S prime:     24.20 cm/s TAPSE (M-mode): 2.9 cm LEFT ATRIUM             Index        RIGHT ATRIUM           Index LA diam:        4.00 cm 1.78 cm/m   RA Area:     20.50 cm LA Vol (A2C):   78.2 ml 34.88 ml/m  RA Volume:   58.60 ml  26.14 ml/m LA Vol (A4C):   49.5 ml 22.08 ml/m LA Biplane Vol: 62.2 ml 27.75 ml/m  AORTIC VALVE AV Area (Vmax):    3.64 cm AV Area (Vmean):   3.69 cm AV Area (VTI):     3.73 cm AV Vmax:           143.00 cm/s AV Vmean:          95.800 cm/s AV VTI:            0.282 m AV Peak Grad:      8.2 mmHg AV Mean Grad:      5.0 mmHg LVOT Vmax:         137.00 cm/s LVOT Vmean:        93.100 cm/s LVOT VTI:          0.277 m LVOT/AV VTI ratio:  0.98  AORTA Ao Root diam: 4.00 cm MITRAL VALVE MV Area (PHT): 2.87 cm    SHUNTS MV Decel Time: 264 msec    Systemic VTI:  0.28 m MV E velocity: 79.00 cm/s  Systemic Diam: 2.20 cm MV A velocity: 66.20 cm/s MV E/A ratio:  1.19 Vishnu Priya Mallipeddi Electronically signed by Diannah Late Mallipeddi Signature Date/Time: 12/24/2024/4:21:46 PM    Final    DG Chest 2 View Result Date: 12/23/2024 CLINICAL DATA:  Shortness of breath and chest pain x1 day. EXAM: CHEST - 2 VIEW COMPARISON:  August 25, 2023 FINDINGS: The heart size and mediastinal contours are within normal limits. Low lung volumes are noted. Both lungs are clear. The visualized skeletal structures are unremarkable. IMPRESSION: No active cardiopulmonary disease. Electronically Signed   By: Suzen Dials M.D.   On: 12/23/2024 14:39        Scheduled Meds:  albuterol   2.5 mg Nebulization BID   aspirin  EC  81 mg Oral QPM   enoxaparin  (LOVENOX ) injection  40 mg Subcutaneous Q24H   escitalopram   20 mg Oral Daily   methylPREDNISolone  (SOLU-MEDROL ) injection  40 mg Intravenous Q12H   pregabalin   150 mg Oral BID   propranolol   60 mg Oral TID   Continuous Infusions:        Derryl Duval, MD [facility] 12/25/2024, 10:22 AM   "

## 2024-12-26 ENCOUNTER — Observation Stay (HOSPITAL_COMMUNITY): Admitting: Anesthesiology

## 2024-12-26 ENCOUNTER — Other Ambulatory Visit (HOSPITAL_COMMUNITY): Payer: Self-pay | Admitting: *Deleted

## 2024-12-26 ENCOUNTER — Observation Stay (HOSPITAL_COMMUNITY)

## 2024-12-26 ENCOUNTER — Encounter (HOSPITAL_COMMUNITY): Payer: Self-pay | Admitting: Internal Medicine

## 2024-12-26 ENCOUNTER — Other Ambulatory Visit: Payer: Self-pay

## 2024-12-26 ENCOUNTER — Encounter (HOSPITAL_COMMUNITY)
Admission: EM | Disposition: A | Discharge status: Home / Self Care | Payer: Self-pay | Source: Home / Self Care | Attending: Emergency Medicine

## 2024-12-26 DIAGNOSIS — I3489 Other nonrheumatic mitral valve disorders: Secondary | ICD-10-CM | POA: Diagnosis not present

## 2024-12-26 DIAGNOSIS — J45909 Unspecified asthma, uncomplicated: Secondary | ICD-10-CM | POA: Diagnosis not present

## 2024-12-26 DIAGNOSIS — J4541 Moderate persistent asthma with (acute) exacerbation: Secondary | ICD-10-CM

## 2024-12-26 DIAGNOSIS — I5189 Other ill-defined heart diseases: Secondary | ICD-10-CM | POA: Diagnosis not present

## 2024-12-26 DIAGNOSIS — R931 Abnormal findings on diagnostic imaging of heart and coronary circulation: Secondary | ICD-10-CM

## 2024-12-26 HISTORY — PX: TEE WITHOUT CARDIOVERSION: SHX5443

## 2024-12-26 LAB — ECHO TEE

## 2024-12-26 SURGERY — ECHOCARDIOGRAM, TRANSESOPHAGEAL
Anesthesia: General

## 2024-12-26 MED ORDER — CHLORHEXIDINE GLUCONATE 0.12 % MT SOLN: 15.0000 mL | Freq: Once | OROMUCOSAL | Status: DC

## 2024-12-26 MED ORDER — FLUTICASONE-SALMETEROL 100-50 MCG/ACT IN AEPB: 1.0000 | INHALATION_SPRAY | Freq: Two times a day (BID) | RESPIRATORY_TRACT | 2 refills | Status: DC

## 2024-12-26 MED ORDER — GUAIFENESIN 200 MG PO TABS: 200.0000 mg | ORAL_TABLET | ORAL | Status: DC | PRN

## 2024-12-26 MED ORDER — SODIUM CHLORIDE FLUSH 0.9 % IV SOLN
INTRAVENOUS | Status: AC
  Filled 2024-12-26: qty 10

## 2024-12-26 MED ORDER — ALBUTEROL SULFATE (2.5 MG/3ML) 0.083% IN NEBU: 2.5000 mg | INHALATION_SOLUTION | RESPIRATORY_TRACT | 12 refills | Status: AC | PRN

## 2024-12-26 MED ORDER — PROPOFOL 500 MG/50ML IV EMUL
INTRAVENOUS | Status: DC | PRN
  Administered 2024-12-26: 150 mg via INTRAVENOUS
  Administered 2024-12-26: 125 ug/kg/min via INTRAVENOUS

## 2024-12-26 MED ORDER — ORAL CARE MOUTH RINSE: 15.0000 mL | Freq: Once | OROMUCOSAL | Status: DC

## 2024-12-26 MED ORDER — LACTATED RINGERS IV SOLN: INTRAVENOUS | Status: DC

## 2024-12-26 MED ORDER — BUTAMBEN-TETRACAINE-BENZOCAINE 2-2-14 % EX AERO
INHALATION_SPRAY | CUTANEOUS | Status: AC
  Filled 2024-12-26: qty 5

## 2024-12-26 MED ORDER — ALUM & MAG HYDROXIDE-SIMETH 200-200-20 MG/5ML PO SUSP
15.0000 mL | Freq: Four times a day (QID) | ORAL | Status: DC | PRN
  Administered 2024-12-26: 15 mL via ORAL
  Filled 2024-12-26: qty 30

## 2024-12-26 MED ORDER — ALBUTEROL SULFATE HFA 108 (90 BASE) MCG/ACT IN AERS: 2.0000 | INHALATION_SPRAY | Freq: Four times a day (QID) | RESPIRATORY_TRACT | 2 refills | Status: AC | PRN

## 2024-12-26 MED ORDER — PREDNISONE 20 MG PO TABS: 40.0000 mg | ORAL_TABLET | Freq: Every day | ORAL | 0 refills | Status: AC

## 2024-12-26 MED ORDER — BUTAMBEN-TETRACAINE-BENZOCAINE 2-2-14 % EX AERO
INHALATION_SPRAY | CUTANEOUS | Status: DC | PRN
  Administered 2024-12-26: 2 via TOPICAL

## 2024-12-26 NOTE — Discharge Summary (Signed)
 "  DISCHARGE SUMMARY  Brendan Ponce  MR#: [id number]  DOB:1981-07-24  Date of Admission: 12/23/2024 Date of Discharge: 12/26/2024  Attending Physician:Ramond Darnell ONEIDA Moores, MD  Patient's ERE:Fnmhjw, Annabella HERO, FNP  Disposition: D/C home   Follow-up Appts:  Follow-up Information     Joesph Annabella HERO, FNP Follow up in 1 week(s).   Specialty: Family Medicine Contact information: [address] [place] [address] [phone]                 Discharge Diagnoses: Asthma exacerbation with acute hypoxic respiratory failure Pleuritic chest pain Anxiety disorder Essential tremor  Initial presentation: 43 year old with a history of asthma, anxiety disorder, and tobacco abuse who presented to the ER 12/28 with 1 day of shortness of breath accompanied by cough wheezing and central pleuritic type chest pain. In the ER he was found to be saturating 82% on room air. Troponins were unremarkable and D-dimer was normal. CXR was negative. Exam revealed evidence of an acute asthma exacerbation.   Hospital Course:  Asthma exacerbation with acute hypoxic respiratory failure Influenza COVID and RSV negative - normal LV function on TTE - CXR without focal infiltrate - wheezing has improved rapidly with use of systemic steroids and inhaled medications - weaned to room air prior to d/c - prescribed long acting maintenance inhaler at d/c to be used along with rescue albuterol  MDI/neb - counseled on use of both    Right atrium free wall lesion RULED OUT  A mobile oval-shaped 1.2 x 0.49 cm RA free wall lesion was incidentally noted on the patient's TTE 12/24/24 - as a result Cardiology was consulted and the patient underwent TEE 12/31 which revealed that 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 and not an actual mass lesion    Pleuritic chest pain Felt to be musculoskeletal in etiology due to coughing and asthma exacerbation - no LV dysfunction on TTE   Anxiety disorder Continue usual  Lexapro    Essential tremor Continue usual propranolol  dose  Allergies as of 12/26/2024   No Known Allergies      Medication List     STOP taking these medications    albuterol  108 (90 Base) MCG/ACT inhaler Commonly known as: VENTOLIN  HFA Replaced by: albuterol  (2.5 MG/3ML) 0.083% nebulizer solution       TAKE these medications    albuterol  (2.5 MG/3ML) 0.083% nebulizer solution Commonly known as: PROVENTIL  Take 3 mLs (2.5 mg total) by nebulization every 4 (four) hours as needed for shortness of breath or wheezing. Replaces: albuterol  108 (90 Base) MCG/ACT inhaler   albuterol  108 (90 Base) MCG/ACT inhaler Commonly known as: VENTOLIN  HFA Inhale 2 puffs into the lungs every 6 (six) hours as needed for wheezing or shortness of breath.   aspirin  EC 81 MG tablet Take 81 mg by mouth every evening. Swallow whole.   escitalopram  20 MG tablet Commonly known as: LEXAPRO  Take 1 tablet (20 mg total) by mouth daily.   fluticasone-salmeterol 100-50 MCG/ACT Aepb Commonly known as: ADVAIR Inhale 1 puff into the lungs 2 (two) times daily.   gabapentin 800 MG tablet Commonly known as: NEURONTIN Take 800 mg by mouth 3 (three) times daily.   guaiFENesin  200 MG tablet Take 1-2 tablets (200-400 mg total) by mouth every 4 (four) hours as needed for cough or to loosen phlegm.   hydrOXYzine  25 MG tablet Commonly known as: ATARAX  TAKE 1 TABLET BY MOUTH 3 TIMES DAILY AS NEEDED FOR ANXIETY.   MULTIVITAMIN PO Take 1 tablet  by mouth daily.   Nucynta  75 MG tablet Generic drug: tapentadol  HCl Take 75 mg by mouth 3 (three) times daily as needed.   predniSONE 20 MG tablet Commonly known as: DELTASONE Take 2 tablets (40 mg total) by mouth daily with breakfast for 5 days.   pregabalin  150 MG capsule Commonly known as: LYRICA  Take 150 mg by mouth 2 (two) times daily.   propranolol  20 MG tablet Commonly known as: INDERAL  Take 60 mg by mouth 3 (three) times daily.   tiZANidine 4 MG  capsule Commonly known as: ZANAFLEX Take 4 mg by mouth 3 (three) times daily.   triamcinolone  cream 0.1 % Commonly known as: KENALOG  APPLY 1 APPLICATION TOPICALLY TWICE A DAY AS NEEDED        Day of Discharge BP 103/60 (BP Location: Right Arm)   Pulse (!) 57   Temp 97.7 F (36.5 C) (Oral)   Resp 16   Ht 6' 2 (1.88 m)   Wt 97.6 kg   SpO2 95%   BMI 27.62 kg/m   Physical Exam: General: No acute respiratory distress Lungs: Clear to auscultation bilaterally without wheezes or crackles Cardiovascular: Regular rate and rhythm without murmur gallop or rub normal S1 and S2 Abdomen: Nontender, nondistended, soft, bowel sounds positive, no rebound, no ascites, no appreciable mass Extremities: No significant cyanosis, clubbing, or edema bilateral lower extremities  Basic Metabolic Panel: Recent Labs  Lab 12/23/24 1318  NA 140  K 4.1  CL 104  CO2 21*  GLUCOSE 98  BUN 9  CREATININE 0.74  CALCIUM 9.3    CBC: Recent Labs  Lab 12/23/24 1318  WBC 5.3  HGB 15.7  HCT 44.4  MCV 91.9  PLT 241    Time spent in discharge (includes decision making & examination of pt): 35 minutes  12/26/2024, 4:27 PM   Reyes IVAR Moores, MD [facility] Office  [phone]      "

## 2024-12-26 NOTE — Plan of Care (Signed)

## 2024-12-26 NOTE — Anesthesia Postprocedure Evaluation (Signed)
"   Anesthesia Post Note  Patient: Brendan Ponce  Procedure(s) Performed: ECHOCARDIOGRAM, TRANSESOPHAGEAL  Patient location during evaluation: PACU Anesthesia Type: General Level of consciousness: awake and alert Pain management: pain level controlled Vital Signs Assessment: post-procedure vital signs reviewed and stable Respiratory status: spontaneous breathing, nonlabored ventilation, respiratory function stable and patient connected to nasal cannula oxygen Cardiovascular status: blood pressure returned to baseline and stable Postop Assessment: no apparent nausea or vomiting Anesthetic complications: no   No notable events documented.   Last Vitals:  Vitals:   12/26/24 1400 12/26/24 1415  BP: (!) 91/50 (!) 88/51  Pulse: (!) 51 (!) 51  Resp: 12 13  Temp:    SpO2: 93% 95%    Last Pain:  Vitals:   12/26/24 1348  TempSrc:   PainSc: Asleep                 Andrea Limes      "

## 2024-12-26 NOTE — Anesthesia Preprocedure Evaluation (Addendum)
"                                    Anesthesia Evaluation  Patient identified by MRN, date of birth, ID band Patient awake    Reviewed: Allergy & Precautions, H&P , NPO status , Patient's Chart, lab work & pertinent test results  Airway Mallampati: II  TM Distance: >3 FB Neck ROM: Full    Dental no notable dental hx.    Pulmonary asthma , Current Smoker   Pulmonary exam normal breath sounds clear to auscultation       Cardiovascular Normal cardiovascular exam+ Valvular Problems/Murmurs  Rhythm:Regular Rate:Normal  Echodensity in right atrium   Neuro/Psych  PSYCHIATRIC DISORDERS Anxiety     negative neurological ROS     GI/Hepatic negative GI ROS, Neg liver ROS,,,  Endo/Other  negative endocrine ROS    Renal/GU negative Renal ROS  negative genitourinary   Musculoskeletal Severe right foot pain Has been receiving ketamine infusions at the pain clinic; last one was 3 weeks ago   Abdominal   Peds negative pediatric ROS (+)  Hematology negative hematology ROS (+)   Anesthesia Other Findings   Reproductive/Obstetrics negative OB ROS                              Anesthesia Physical Anesthesia Plan  ASA: 3  Anesthesia Plan: General   Post-op Pain Management:    Induction: Intravenous  PONV Risk Score and Plan:   Airway Management Planned: Nasal Cannula and Natural Airway  Additional Equipment:   Intra-op Plan:   Post-operative Plan:   Informed Consent: I have reviewed the patients History and Physical, chart, labs and discussed the procedure including the risks, benefits and alternatives for the proposed anesthesia with the patient or authorized representative who has indicated his/her understanding and acceptance.     Dental advisory given  Plan Discussed with: CRNA  Anesthesia Plan Comments:          Anesthesia Quick Evaluation  "

## 2024-12-26 NOTE — Transfer of Care (Signed)
 Immediate Anesthesia Transfer of Care Note  Patient: Koden Hunzeker  Procedure(s) Performed: ECHOCARDIOGRAM, TRANSESOPHAGEAL  Patient Location: PACU  Anesthesia Type:General  Level of Consciousness: drowsy  Airway & Oxygen Therapy: Patient Spontanous Breathing and Patient connected to nasal cannula oxygen  Post-op Assessment: Report given to RN and Post -op Vital signs reviewed and stable  Post vital signs: Reviewed and stable  Last Vitals:  Vitals Value Taken Time  BP 81/65   Temp 97.7   Pulse 63   Resp 15   SpO2 91%     Last Pain:  Vitals:   12/26/24 1213  TempSrc: Oral  PainSc: 3       Patients Stated Pain Goal: 7 (12/26/24 1213)  Complications: No notable events documented.

## 2024-12-26 NOTE — H&P (Signed)
 "   CARDIOLOGY PRE=PROCEDURE HISTORY AND PHYSICAL    Patient ID: Brendan Ponce; [id number]; 25-Apr-1981   Admit date: 12/23/2024 Date of Consult: 12/26/2024  Primary Care Provider: Joesph Annabella HERO, FNP Primary Cardiologist:  Primary Electrophysiologist:    History of Present Illness:   Mr. Dunaj is here for TEE for RA echodensity. Stable.  Past Medical History:  Diagnosis Date   Anxiety    Asthma     Past Surgical History:  Procedure Laterality Date   FOOT SURGERY Right        Inpatient Medications: Scheduled Meds:  [MAR Hold] albuterol   2.5 mg Nebulization BID   [MAR Hold] aspirin  EC  81 mg Oral QPM   chlorhexidine  15 mL Mouth/Throat Once   Or   mouth rinse  15 mL Mouth Rinse Once   [MAR Hold] enoxaparin  (LOVENOX ) injection  40 mg Subcutaneous Q24H   [MAR Hold] escitalopram   20 mg Oral Daily   [MAR Hold] methylPREDNISolone  (SOLU-MEDROL ) injection  40 mg Intravenous Q12H   [MAR Hold] pregabalin   150 mg Oral BID   [MAR Hold] propranolol   60 mg Oral TID   Continuous Infusions:  sodium chloride      lactated ringers     PRN Meds: [MAR Hold] acetaminophen  **OR** [MAR Hold] acetaminophen , [MAR Hold] albuterol , [MAR Hold] alum & mag hydroxide-simeth, [MAR Hold] bisacodyl , [MAR Hold] HYDROcodone -acetaminophen , [MAR Hold] hydrOXYzine , [MAR Hold] ondansetron  **OR** [MAR Hold] ondansetron  (ZOFRAN ) IV, [MAR Hold] polyethylene glycol, [MAR Hold] tapentadol   Allergies:   Allergies[1]  Social History:   Social History   Socioeconomic History   Marital status: Married    Spouse name: Not on file   Number of children: Not on file   Years of education: Not on file   Highest education level: Not on file  Occupational History   Not on file  Tobacco Use   Smoking status: Some Days    Types: Cigarettes   Smokeless tobacco: Current    Types: Chew  Vaping Use   Vaping status: Never Used  Substance and Sexual Activity   Alcohol use: No   Drug use: No   Sexual  activity: Not on file  Other Topics Concern   Not on file  Social History Narrative   Not on file   Social Drivers of Health   Tobacco Use: High Risk (12/26/2024)   Patient History    Smoking Tobacco Use: Some Days    Smokeless Tobacco Use: Current    Passive Exposure: Not on file  Financial Resource Strain: Low Risk (01/19/2022)   Received from [facility]   Overall Financial Resource Strain (CARDIA)    Difficulty of Paying Living Expenses: Not very hard  Food Insecurity: No Food Insecurity (12/23/2024)   Epic    Worried About Radiation Protection Practitioner of Food in the Last Year: Never true    Ran Out of Food in the Last Year: Never true  Transportation Needs: No Transportation Needs (12/23/2024)   Epic    Lack of Transportation (Medical): No    Lack of Transportation (Non-Medical): No  Physical Activity: Unknown (01/19/2022)   Received from [facility]   Exercise Vital Sign    On average, how many days per week do you engage in moderate to strenuous exercise (like a brisk walk)?: Patient declined    On average, how many minutes do you engage in exercise at this level?: 0 min  Stress: Stress Concern Present (01/19/2022)   Received from [facility] - Occupational Stress Questionnaire    Feeling of Stress : Very much  Social Connections: Socially Integrated (12/23/2024)   Social Connection and Isolation Panel    Frequency of Communication with Friends and Family: More than three times a week    Frequency of Social Gatherings with Friends and Family: More than three times a week    Attends Religious Services: More than 4 times per year    Active Member of [employer] or Organizations: Yes    Attends Banker Meetings: More than 4 times per year    Marital Status: Married  Catering Manager Violence: Not At Risk (12/23/2024)   Epic    Fear of Current or Ex-Partner: No    Emotionally Abused: No    Physically Abused: No    Sexually Abused:  No  Depression (PHQ2-9): Low Risk (09/24/2024)   Depression (PHQ2-9)    PHQ-2 Score: 2  Alcohol Screen: Not on file  Housing: Low Risk (12/23/2024)   Epic    Unable to Pay for Housing in the Last Year: No    Number of Times Moved in the Last Year: 0    Homeless in the Last Year: No  Utilities: Not At Risk (12/23/2024)   Epic    Threatened with loss of utilities: No  Health Literacy: Not on file    Family History:    Family History  Problem Relation Age of Onset   Cancer Mother        pancreatic   Cancer Father        kidney   Asthma Daughter    Asthma Son      ROS:  Please see the history of present illness.  ROS All other ROS reviewed and negative.     Physical Exam/Data:   Vitals:   12/26/24 0156 12/26/24 0518 12/26/24 0835 12/26/24 1213  BP:  (!) 101/58  124/74  Pulse:  60  (!) 57  Resp:  18  14  Temp:  97.9 F (36.6 C)  98.1 F (36.7 C)  TempSrc:  Oral  Oral  SpO2: 96% 93% 97% 97%  Weight:      Height:        Intake/Output Summary (Last 24 hours) at 12/26/2024 1324 Last data filed at 12/26/2024 0500 Gross per 24 hour  Intake 240 ml  Output --  Net 240 ml   Filed Weights   12/23/24 1251 12/23/24 1723  Weight: 95.3 kg 97.6 kg   Body mass index is 27.62 kg/m.  General:  Well nourished, well developed, in no acute distress HEENT: normal Lymph: no adenopathy Neck: no JVD Endocrine:  No thryomegaly Vascular: No carotid bruits; FA pulses 2+ bilaterally without bruits  Cardiac:  normal S1, S2; RRR; no murmur  Lungs:  clear to auscultation bilaterally, no wheezing, rhonchi or rales  Abd: soft, nontender, no hepatomegaly  Ext: no edema Musculoskeletal:  No deformities, BUE and BLE strength normal and equal Skin: warm and dry  Neuro:  CNs 2-12 intact, no focal abnormalities noted Psych:  Normal affect   Laboratory Data:  Chemistry Recent Labs  Lab 12/23/24 1318  NA 140  K 4.1  CL 104  CO2 21*  GLUCOSE 98  BUN 9  CREATININE 0.74  CALCIUM  9.3  GFRNONAA >60  ANIONGAP 15    No results for input(s): PROT, ALBUMIN, AST, ALT, ALKPHOS, BILITOT in the last 168 hours. Hematology Recent Labs  Lab 12/23/24 1318  WBC 5.3  RBC 4.83  HGB  15.7  HCT 44.4  MCV 91.9  MCH 32.5  MCHC 35.4  RDW 12.3  PLT 241   Cardiac EnzymesNo results for input(s): TROPONINI in the last 168 hours. No results for input(s): TROPIPOC in the last 168 hours.  BNPNo results for input(s): BNP, PROBNP in the last 168 hours.  DDimer  Recent Labs  Lab 12/23/24 1318  DDIMER 0.28    Radiology/Studies:    Assessment and Plan:   RA echodensity  After careful review of history and examination, the risks and benefits of transesophageal echocardiogram have been explained including risks of esophageal damage, perforation (1:10,000 risk), bleeding, pharyngeal hematoma as well as other potential complications associated with conscious sedation including aspiration, arrhythmia, respiratory failure and death. Alternatives to treatment were discussed, questions were answered. Patient is willing to proceed.    For questions or updates, please contact [facility] Please consult [email or web address] for contact info under Cardiology/STEMI.   Signed, Ayub Kirsh Priya Sheilla Maris, MD 12/26/2024 1:24 PM     [1] No Known Allergies  "

## 2024-12-26 NOTE — CV Procedure (Signed)
" ° °  TRANSESOPHAGEAL ECHOCARDIOGRAM  NAME:  Brendan Ponce    MRN: [id number] DOB:  [birth year]    ADMIT DATE: 12/23/2024  INDICATIONS: RA echodensity on TTE  PROCEDURE:  Informed consent was obtained prior to the procedure. After a procedural time-out, the oropharynx was anesthetized and the patient was sedated by the anesthesia service. The transesophageal probe was inserted in the esophagus and stomach without difficulty and multiple views were obtained. Anesthesia was monitored by Dr. Herschell. The patient had normal neuro status and respiratory status post procedure with vitals stable as recorded elsewhere.  Adequate airway was maintained throughout and vital signs monitored per protocol.  KEY FINDINGS: TEE showed echodensity in the right atrium consistent with Chiari network which is a normal variant. Follow up on official TEE report.  Flemon Kelty Priya Karlin Heilman, MD [place]  [facility]  1:48 PM "

## 2024-12-26 NOTE — Progress Notes (Signed)
*  PRELIMINARY RESULTS* Echocardiogram Echocardiogram Transesophageal has been performed.  Brendan Ponce 12/26/2024, 2:37 PM

## 2024-12-27 ENCOUNTER — Encounter (HOSPITAL_COMMUNITY): Payer: Self-pay | Admitting: Internal Medicine

## 2024-12-28 ENCOUNTER — Ambulatory Visit: Admitting: *Deleted

## 2025-01-01 ENCOUNTER — Ambulatory Visit: Payer: Worker's Compensation | Attending: Orthopedic Surgery | Admitting: Physical Therapy

## 2025-01-01 ENCOUNTER — Encounter: Payer: Self-pay | Admitting: Physical Therapy

## 2025-01-01 DIAGNOSIS — R29818 Other symptoms and signs involving the nervous system: Secondary | ICD-10-CM | POA: Diagnosis present

## 2025-01-01 DIAGNOSIS — M6281 Muscle weakness (generalized): Secondary | ICD-10-CM | POA: Diagnosis present

## 2025-01-01 DIAGNOSIS — R2689 Other abnormalities of gait and mobility: Secondary | ICD-10-CM | POA: Insufficient documentation

## 2025-01-01 DIAGNOSIS — R29898 Other symptoms and signs involving the musculoskeletal system: Secondary | ICD-10-CM | POA: Insufficient documentation

## 2025-01-01 DIAGNOSIS — M25571 Pain in right ankle and joints of right foot: Secondary | ICD-10-CM | POA: Diagnosis present

## 2025-01-01 NOTE — Therapy (Signed)
 " OUTPATIENT PHYSICAL THERAPY TREATMENT   Patient Name: Brendan Ponce MRN: [id number] DOB:13-Jun-1981, 44 y.o., male Today's Date: 01/01/2025  END OF SESSION:  PT End of Session - 01/01/25 1520     Visit Number 3    Number of Visits 18    Date for Recertification  02/19/25    Authorization Type Worker's Comp    PT Start Time 1520    PT Stop Time 1600    PT Time Calculation (min) 40 min    Activity Tolerance Patient tolerated treatment well           Past Medical History:  Diagnosis Date   Anxiety    Asthma    Past Surgical History:  Procedure Laterality Date   FOOT SURGERY Right    TEE WITHOUT CARDIOVERSION N/A 12/26/2024   Procedure: ECHOCARDIOGRAM, TRANSESOPHAGEAL;  Surgeon: Stacia Diannah SQUIBB, MD;  Location: [facility];  Service: Cardiovascular;  Laterality: N/A;   Patient Active Problem List   Diagnosis Date Noted   Abnormal echocardiogram 12/26/2024   Asthma exacerbation 12/23/2024   Acute respiratory failure with hypoxia (HCC) 12/23/2024   Mild persistent asthma without complication 01/15/2022   Generalized anxiety disorder 01/15/2022   Panic attacks 01/15/2022   Essential tremor 01/15/2022   Injury of right foot 01/15/2022   Microscopic hematuria 12/06/2016   Proteinuria 12/06/2016    PCP: Joesph Annabella HERO, FNP  REFERRING PROVIDER: Joesph Annabella HERO, FNP  REFERRING DIAG: [phone] (ICD-10-CM) - History of foot surgery G90.521 (ICD-10-CM) - Complex regional pain syndrome i of right lower limb  THERAPY DIAG:  Muscle weakness (generalized)  Other abnormalities of gait and mobility  Other symptoms and signs involving the nervous system  Other symptoms and signs involving the musculoskeletal system  Pain in right ankle and joints of right foot  Rationale for Evaluation and Treatment: Rehabilitation  ONSET DATE: 3.5 years s/p Right foot crush injury with I&D, pinning of the fourth metatarsal, degloving injury plantar foot    SUBJECTIVE:   SUBJECTIVE  STATEMENT: Pt states he was in the hospital for 4 days after an asthma attack. Reports he was sore for maybe 2 days after last session.   From eval: Just got exosym brace <1 month ago. Did 1 week of quick training with PT. Wanted him to continue to keep using brace. Brace has been helping with his step and his pain but still gets pain in the arch. Uses lofstrand crutches when not using brace but otherwise does use w/c in the house. Uses hiking poles with the brace -- feels wobbly without any a/d. Has gone to [facility] in [place] and tried dry needling, and TENS initially for the CRPS. Reports he does have some ankle movement and pain mostly in the medial foot. Barely able to move toes. Did get email attachment from [facility] clinic about the brace.   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 Yes  PRECAUTIONS: Fall  RED FLAGS: None   WEIGHT BEARING RESTRICTIONS: No  FALLS:  Has patient fallen in last 6 months? No  LIVING ENVIRONMENT: Lives with: lives with their spouse Lives in: House/apartment Stairs: 5-6 steps to get in the house but rest of the house is one level Has following equipment at home: Crutches, Wheelchair (manual), and hiking poles  OCCUPATION: Not working currently  PLOF: Independent with basic ADLs  PATIENT GOALS: Improve movement, return to recreational activities, improve strength, less difficulty with home activities  NEXT MD  VISIT: in 6 weeks  OBJECTIVE:  Note: Objective measures were completed at Evaluation unless otherwise noted.  DIAGNOSTIC FINDINGS: Nothing recent  PATIENT SURVEYS:  LEFS  Extreme difficulty/unable (0), Quite a bit of difficulty (1), Moderate difficulty (2), Little difficulty (3), No difficulty (4) Survey date:  12/11/24  Any of your usual work, housework or school activities 1  2. Usual hobbies, recreational or sporting activities 1  3. Getting into/out of the bath 2  4.  Walking between rooms 1  5. Putting on socks/shoes 2  6. Squatting  1  7. Lifting an object, like a bag of groceries from the floor 1  8. Performing light activities around your home 1  9. Performing heavy activities around your home 0  10. Getting into/out of a car 1  11. Walking 2 blocks 0  12. Walking 1 mile 0  13. Going up/down 10 stairs (1 flight) 0  14. Standing for 1 hour 0  15.  sitting for 1 hour 4  16. Running on even ground 0  17. Running on uneven ground 0  18. Making sharp turns while running fast 0  19. Hopping  0  20. Rolling over in bed 4  Score total:  19     COGNITION: Overall cognitive status: Within functional limits for tasks assessed     SENSATION: Impaired -- sensitive to all pressure with increased pain  EDEMA:  None noted today  MUSCLE LENGTH: Did not assess  POSTURE: No Significant postural limitations  PALPATION: Did not test  LOWER EXTREMITY ROM: Limited R foot/ankle movement   LOWER EXTREMITY MMT:  MMT Right eval Left eval  Hip flexion 3+ 4  Hip extension 3 4-  Hip abduction 3 4-  Hip adduction    Hip internal rotation 4- 5  Hip external rotation 3+ 5  Knee flexion 5 5  Knee extension 4- 5  Ankle dorsiflexion    Ankle plantarflexion    Ankle inversion    Ankle eversion     (Blank rows = not tested)  LOWER EXTREMITY SPECIAL TESTS:  Did not assess  FUNCTIONAL TESTS:  Unable to perform tandem stance 1/2 tandem stance: 16.01 sec 30 sec sit to stand: 8 mostly towards L LE TUG: 15.3 sec, 14.43 sec, 14.44 sec; using UEs to push off chair and 2 hiking poles  GAIT: Distance walked: Into clinic Assistive device utilized: 2 hiking poles and exosym brace donned on L Level of assistance: Modified independence Comments: Antalgic, diminished heel strike and knee extension -- tends to amb with more a toe off pattern on R                                                                                                                                 TREATMENT DATE:  01/01/25 Nustep L4 x 10 min LEs only Supine deadbug alternating UE/LEs with pball 2x10 Supine bridge, feet on pball, knees extended 2x10x5 Sidelying hip  abduction 3x10 Quadruped academic librarian with hip hike 2x10 Quadruped hip ext 2x10 Quad lean backs in tall kneeling 2x10  12/17/24 Nustep L4 x 8 min LEs only Supine PPT x10 Supine bridge 2x10x5 Supine deadbug 10x5 with pball squeeze LTR x1 min Supine Piriformis stretch x30 Sidelying hip abduction 2 sets to fatigue Prone hip extension 2 sets to fatigue Modified downward dog with alternating knee ext x5 attempted but too painful for pt Standing against wall terminal knee ext from small range knee flexion x10    12/11/24 Self care: see education below    PATIENT EDUCATION:  Education details: Exam findings, POC Person educated: Patient Education method: Medical Illustrator Education comprehension: verbalized understanding, returned demonstration, and needs further education  HOME EXERCISE PROGRAM: Access Code: [email or web address] URL: [email or web address] Date: 12/17/2024 Prepared by: Gurfateh Mcclain April Earnie Starring  Exercises - Supine Bridge  - 1 x daily - 7 x weekly - 2 sets - 10 reps - Isometric Dead Bug  - 1 x daily - 7 x weekly - 10 reps - 5 sec hold - Beginner Side Leg Lift  - 1 x daily - 7 x weekly - 2 sets - 10 reps - Beginner Prone Single Leg Raise  - 1 x daily - 7 x weekly - 2 sets - 10 reps  ASSESSMENT:  CLINICAL IMPRESSION: Progressive core, quad and glute strengthening this session to improve overall trunk stability for upright activities. Able to tolerate quad lean backs for quad strengthening with less pain.   OBJECTIVE IMPAIRMENTS: Abnormal gait, decreased activity tolerance, decreased balance, decreased coordination, decreased endurance, decreased mobility, difficulty walking, decreased ROM, decreased strength, increased fascial restrictions, impaired sensation,  improper body mechanics, postural dysfunction, and pain.   ACTIVITY LIMITATIONS: carrying, lifting, bending, standing, squatting, stairs, transfers, locomotion level, and caring for others  PARTICIPATION LIMITATIONS: cleaning, laundry, shopping, community activity, and yard work  PERSONAL FACTORS: Age, Fitness, Past/current experiences, and Time since onset of injury/illness/exacerbation are also affecting patient's functional outcome.   REHAB POTENTIAL: Good  CLINICAL DECISION MAKING: Evolving/moderate complexity  EVALUATION COMPLEXITY: Moderate   GOALS: Goals reviewed with patient? Yes  SHORT TERM GOALS: Target date: 01/08/2025  Pt will be ind with initial HEP Baseline: Goal status: INITIAL  2.  Pt will be able to perform tandem stance x 10 sec to demo increasing R LE weightbearing and stability Baseline:  Goal status: INITIAL  3.  Pt will be able to amb with only one walking aid for stability x 400' for home distances and normal reciprocal pattern Baseline:  Goal status: INITIAL  4.  Pt will be able to perform at least 15 sit to stands in 30 sec to demo increasing functional LE strength and endurance Baseline:  Goal status: INITIAL    LONG TERM GOALS: Target date: 02/05/2025   Pt will be ind with management and progression of HEP Baseline:  Goal status: INITIAL  2.  Pt will be able to amb independently x1000' with LRAD Baseline:  Goal status: INITIAL  3.  Pt will have improved 30 sec sit to stand to at least 18 to be within his age norms (age norm of 59.2 with a 6.3 standard deviation) Baseline:  Goal status: INITIAL  4.  Pt will have improved TUG to </=10 sec with LRAD Baseline: 14.43 sec Goal status: INITIAL  5.  Pt will have improved tandem stance to at least 20 sec to demo increased standing stability and balance Baseline:  Goal status: INITIAL  6.  Pt will  have improved LEFS to >/=31 Baseline:  Goal status: INITIAL   PLAN:  PT FREQUENCY: 18  visits per Worker's Comp  PT DURATION: 6-8 weeks  PLANNED INTERVENTIONS: 97164- PT Re-evaluation, 97750- Physical Performance Testing, 97110-Therapeutic exercises, 97530- Therapeutic activity, [id number]- Neuromuscular re-education, 97535- Self Care, [id number]- Manual therapy, [id number]- Gait training, [phone]- Orthotic Initial, [phone]- Orthotic/Prosthetic subsequent, H9716- Electrical stimulation (unattended), [phone]- Ultrasound, 79439 (1-2 muscles), 20561 (3+ muscles)- Dry Needling, Patient/Family education, Balance training, Stair training, Taping, Joint mobilization, Cryotherapy, and Moist heat  PLAN FOR NEXT SESSION: Initiate core, hip and knee strengthening. Work on standing postural stability and weight bearing. Reduce foot/ankle motion as this is counterintuitive to the use of exosym. Gait training, wean to 1 a/d with amb as able.    Marylynn Rigdon April Ma L Brenen Beigel, PT, DPT 01/01/2025, 4:16 PM  "

## 2025-01-02 ENCOUNTER — Encounter: Payer: Self-pay | Admitting: Family Medicine

## 2025-01-02 ENCOUNTER — Ambulatory Visit

## 2025-01-02 VITALS — BP 112/65 | HR 72 | Temp 98.6°F | Ht 74.0 in | Wt 224.8 lb

## 2025-01-02 DIAGNOSIS — J4541 Moderate persistent asthma with (acute) exacerbation: Secondary | ICD-10-CM | POA: Diagnosis not present

## 2025-01-02 MED ORDER — AIRSUPRA 90-80 MCG/ACT IN AERO: 2.0000 | INHALATION_SPRAY | RESPIRATORY_TRACT | 3 refills | Status: AC | PRN

## 2025-01-02 MED ORDER — PREDNISONE 20 MG PO TABS: 40.0000 mg | ORAL_TABLET | Freq: Every day | ORAL | 0 refills | Status: AC

## 2025-01-02 NOTE — Patient Instructions (Signed)
 Marketing Executive co pay card

## 2025-01-02 NOTE — Progress Notes (Signed)
 "  Established Patient Office Visit  Subjective   Patient ID: Brendan Ponce, male    DOB: [birth year]  Age: 44 y.o. MRN: [id number]  Chief Complaint  Patient presents with   Asthma    HPI  History of Present Illness   Brendan Ponce is a 44 year old male with asthma who presents with persistent asthma exacerbation symptoms.  He was admitted to the hospital from December 28th to the 31st for an asthma exacerbation, experiencing increased shortness of breath, wheezing, and coughing, with an oxygen saturation of 82% on room air. During his hospital stay, he was treated with steroids and nebulizer treatments. A chest x-ray showed no signs of pneumonia.  While hospitalized, a mobile oval-shaped lesion was found in his heart, and after a transesophageal echocardiogram (TEE), he was told it was a benign remnant from fetal development.   Since discharge, he has been using Advair once daily, although not as prescribed. He continues to use his albuterol  nebulizer multiple times a day without significant relief. He has been using the nebulizer instead, sometimes requiring the full treatment to achieve any improvement. He reports persistent chest tightness, which is atypical for his usual asthma symptoms, which is primarily wheezing.  He has experienced some chest discomfort and fluttering sensations, which he attributes to the frequent use of albuterol . There is a slight improvement in symptoms since hospital discharge.  He has a history of asthma and is familiar with his typical symptoms, which usually include deep wheezing and coughing.        ROS As per HPI.    Objective:     BP 112/65   Pulse 72   Temp 98.6 F (37 C) (Temporal)   Ht 6' 2 (1.88 m)   Wt 224 lb 12.8 oz (102 kg)   SpO2 95%   BMI 28.86 kg/m    Physical Exam Vitals and nursing note reviewed.  Constitutional:      General: He is not in acute distress.    Appearance: He is not ill-appearing, toxic-appearing or  diaphoretic.  Cardiovascular:     Rate and Rhythm: Normal rate.     Heart sounds: Normal heart sounds. No murmur heard. Pulmonary:     Effort: Pulmonary effort is normal. No respiratory distress.     Breath sounds: No wheezing, rhonchi or rales.  Chest:     Chest wall: No tenderness.  Skin:    General: Skin is warm and dry.  Neurological:     Mental Status: He is alert and oriented to person, place, and time. Mental status is at baseline.  Psychiatric:        Mood and Affect: Mood normal.        Behavior: Behavior normal.      No results found for any visits on 01/02/25.    The 10-year ASCVD risk score (Arnett DK, et al., 2019) is: 2.7%    Assessment & Plan:   Garik was seen today for asthma.  Diagnoses and all orders for this visit:  Moderate persistent asthma with exacerbation -     Albuterol -Budesonide (AIRSUPRA ) 90-80 MCG/ACT AERO; Inhale 2 puffs into the lungs as needed (do not exceed 12 uses in 24 hours). -     predniSONE  (DELTASONE ) 20 MG tablet; Take 2 tablets (40 mg total) by mouth daily with breakfast for 5 days. -     Ambulatory referral to Pulmonology   Assessment and Plan    Moderate persistent asthma with acute exacerbation Recent hospitalization for  exacerbation with persistent symptoms suggests ongoing airway inflammation. - Prescribed prednisone  40 mg twice daily for 5 days. - Prescribed Air Supra inhaler as rescue inhaler, up to 12 times in 24 hours. - Referred to pulmonologist for pulmonary for further evaluation - Advised compliance with Advair inhaler twice daily for lung function improvement. - Instructed to contact if symptoms worsen or persist.      Return to office for new or worsening symptoms, or if symptoms persist.   The patient indicates understanding of these issues and agrees with the plan.   Brendan Ponce Search, FNP "

## 2025-01-07 ENCOUNTER — Ambulatory Visit: Payer: Worker's Compensation | Admitting: Physical Therapy

## 2025-01-07 NOTE — Therapy (Unsigned)
 " OUTPATIENT PHYSICAL THERAPY TREATMENT   Patient Name: Brendan Ponce MRN: [id number] DOB:October 16, 1981, 44 y.o., male Today's Date: 01/07/2025  END OF SESSION:     Past Medical History:  Diagnosis Date   Anxiety    Asthma    Past Surgical History:  Procedure Laterality Date   FOOT SURGERY Right    TEE WITHOUT CARDIOVERSION N/A 12/26/2024   Procedure: ECHOCARDIOGRAM, TRANSESOPHAGEAL;  Surgeon: Stacia Diannah SQUIBB, MD;  Location: [facility];  Service: Cardiovascular;  Laterality: N/A;   Patient Active Problem List   Diagnosis Date Noted   Abnormal echocardiogram 12/26/2024   Asthma exacerbation 12/23/2024   Acute respiratory failure with hypoxia (HCC) 12/23/2024   Mild persistent asthma without complication 01/15/2022   Generalized anxiety disorder 01/15/2022   Panic attacks 01/15/2022   Essential tremor 01/15/2022   Injury of right foot 01/15/2022   Microscopic hematuria 12/06/2016   Proteinuria 12/06/2016    PCP: Joesph Annabella HERO, FNP  REFERRING PROVIDER: Joesph Annabella HERO, FNP  REFERRING DIAG: [id number] (ICD-10-CM) - History of foot surgery G90.521 (ICD-10-CM) - Complex regional pain syndrome i of right lower limb  THERAPY DIAG:  No diagnosis found.  Rationale for Evaluation and Treatment: Rehabilitation  ONSET DATE: 3.5 years s/p Right foot crush injury with I&D, pinning of the fourth metatarsal, degloving injury plantar foot    SUBJECTIVE:   SUBJECTIVE STATEMENT: Pt states he was in the hospital for 4 days after an asthma attack. Reports he was sore for maybe 2 days after last session.   From eval: Just got exosym brace <1 month ago. Did 1 week of quick training with PT. Wanted him to continue to keep using brace. Brace has been helping with his step and his pain but still gets pain in the arch. Uses lofstrand crutches when not using brace but otherwise does use w/c in the house. Uses hiking poles with the brace -- feels wobbly without any a/d. Has gone to  [facility] in [place] and tried dry needling, and TENS initially for the CRPS. Reports he does have some ankle movement and pain mostly in the medial foot. Barely able to move toes. Did get email attachment from [facility] clinic about the brace.   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 Yes  PRECAUTIONS: Fall  RED FLAGS: None   WEIGHT BEARING RESTRICTIONS: No  FALLS:  Has patient fallen in last 6 months? No  LIVING ENVIRONMENT: Lives with: lives with their spouse Lives in: House/apartment Stairs: 5-6 steps to get in the house but rest of the house is one level Has following equipment at home: Crutches, Wheelchair (manual), and hiking poles  OCCUPATION: Not working currently  PLOF: Independent with basic ADLs  PATIENT GOALS: Improve movement, return to recreational activities, improve strength, less difficulty with home activities  NEXT MD VISIT: in 6 weeks  OBJECTIVE:  Note: Objective measures were completed at Evaluation unless otherwise noted.  DIAGNOSTIC FINDINGS: Nothing recent  PATIENT SURVEYS:  LEFS  Extreme difficulty/unable (0), Quite a bit of difficulty (1), Moderate difficulty (2), Little difficulty (3), No difficulty (4) Survey date:  12/11/24  Any of your usual work, housework or school activities 1  2. Usual hobbies, recreational or sporting activities 1  3. Getting into/out of the bath 2  4. Walking between rooms 1  5. Putting on socks/shoes 2  6. Squatting  1  7. Lifting an object, like a bag of groceries from the floor 1  8. Performing light activities around your home 1  9. Performing heavy activities around your home 0  10. Getting into/out of a car 1  11. Walking 2 blocks 0  12. Walking 1 mile 0  13. Going up/down 10 stairs (1 flight) 0  14. Standing for 1 hour 0  15.  sitting for 1 hour 4  16. Running on even ground 0  17. Running on uneven ground 0  18. Making sharp turns while  running fast 0  19. Hopping  0  20. Rolling over in bed 4  Score total:  19     COGNITION: Overall cognitive status: Within functional limits for tasks assessed     SENSATION: Impaired -- sensitive to all pressure with increased pain  EDEMA:  None noted today  MUSCLE LENGTH: Did not assess  POSTURE: No Significant postural limitations  PALPATION: Did not test  LOWER EXTREMITY ROM: Limited R foot/ankle movement   LOWER EXTREMITY MMT:  MMT Right eval Left eval  Hip flexion 3+ 4  Hip extension 3 4-  Hip abduction 3 4-  Hip adduction    Hip internal rotation 4- 5  Hip external rotation 3+ 5  Knee flexion 5 5  Knee extension 4- 5  Ankle dorsiflexion    Ankle plantarflexion    Ankle inversion    Ankle eversion     (Blank rows = not tested)  LOWER EXTREMITY SPECIAL TESTS:  Did not assess  FUNCTIONAL TESTS:  Unable to perform tandem stance 1/2 tandem stance: 16.01 sec 30 sec sit to stand: 8 mostly towards L LE TUG: 15.3 sec, 14.43 sec, 14.44 sec; using UEs to push off chair and 2 hiking poles  GAIT: Distance walked: Into clinic Assistive device utilized: 2 hiking poles and exosym brace donned on L Level of assistance: Modified independence Comments: Antalgic, diminished heel strike and knee extension -- tends to amb with more a toe off pattern on R                                                                                                                                TREATMENT DATE:  01/01/25 Nustep L4 x 10 min LEs only Supine deadbug alternating UE/LEs with pball 2x10 Supine bridge, feet on pball, knees extended 2x10x5 Sidelying hip abduction 3x10 Quadruped fire hydrant with hip hike 2x10 Quadruped hip ext 2x10 Quad lean backs in tall kneeling 2x10  12/17/24 Nustep L4 x 8 min LEs only Supine PPT x10 Supine bridge 2x10x5 Supine deadbug 10x5 with pball squeeze LTR x1 min Supine Piriformis stretch x30 Sidelying hip abduction 2 sets to  fatigue Prone hip extension 2 sets to fatigue Modified downward dog with alternating knee ext x5 attempted but too painful for pt Standing against wall terminal knee ext from small range knee flexion x10    12/11/24 Self care: see education below    PATIENT EDUCATION:  Education details: Exam findings, POC Person educated:  Patient Education method: Explanation and Demonstration Education comprehension: verbalized understanding, returned demonstration, and needs further education  HOME EXERCISE PROGRAM: Access Code: [email or web address] URL: [email or web address] Date: 12/17/2024 Prepared by: Laird Runnion April Earnie Starring  Exercises - Supine Bridge  - 1 x daily - 7 x weekly - 2 sets - 10 reps - Isometric Dead Bug  - 1 x daily - 7 x weekly - 10 reps - 5 sec hold - Beginner Side Leg Lift  - 1 x daily - 7 x weekly - 2 sets - 10 reps - Beginner Prone Single Leg Raise  - 1 x daily - 7 x weekly - 2 sets - 10 reps  ASSESSMENT:  CLINICAL IMPRESSION: Progressive core, quad and glute strengthening this session to improve overall trunk stability for upright activities. Able to tolerate quad lean backs for quad strengthening with less pain.   OBJECTIVE IMPAIRMENTS: Abnormal gait, decreased activity tolerance, decreased balance, decreased coordination, decreased endurance, decreased mobility, difficulty walking, decreased ROM, decreased strength, increased fascial restrictions, impaired sensation, improper body mechanics, postural dysfunction, and pain.   ACTIVITY LIMITATIONS: carrying, lifting, bending, standing, squatting, stairs, transfers, locomotion level, and caring for others  PARTICIPATION LIMITATIONS: cleaning, laundry, shopping, community activity, and yard work  PERSONAL FACTORS: Age, Fitness, Past/current experiences, and Time since onset of injury/illness/exacerbation are also affecting patient's functional outcome.   REHAB POTENTIAL: Good  CLINICAL DECISION MAKING:  Evolving/moderate complexity  EVALUATION COMPLEXITY: Moderate   GOALS: Goals reviewed with patient? Yes  SHORT TERM GOALS: Target date: 01/08/2025  Pt will be ind with initial HEP Baseline: Goal status: INITIAL  2.  Pt will be able to perform tandem stance x 10 sec to demo increasing R LE weightbearing and stability Baseline:  Goal status: INITIAL  3.  Pt will be able to amb with only one walking aid for stability x 400' for home distances and normal reciprocal pattern Baseline:  Goal status: INITIAL  4.  Pt will be able to perform at least 15 sit to stands in 30 sec to demo increasing functional LE strength and endurance Baseline:  Goal status: INITIAL    LONG TERM GOALS: Target date: 02/05/2025   Pt will be ind with management and progression of HEP Baseline:  Goal status: INITIAL  2.  Pt will be able to amb independently x1000' with LRAD Baseline:  Goal status: INITIAL  3.  Pt will have improved 30 sec sit to stand to at least 16 to be within his age norms (age norm of 38.2 with a 6.3 standard deviation) Baseline:  Goal status: INITIAL  4.  Pt will have improved TUG to </=10 sec with LRAD Baseline: 14.43 sec Goal status: INITIAL  5.  Pt will have improved tandem stance to at least 20 sec to demo increased standing stability and balance Baseline:  Goal status: INITIAL  6.  Pt will have improved LEFS to >/=31 Baseline:  Goal status: INITIAL   PLAN:  PT FREQUENCY: 18 visits per Worker's Comp  PT DURATION: 6-8 weeks  PLANNED INTERVENTIONS: 97164- PT Re-evaluation, 97750- Physical Performance Testing, 97110-Therapeutic exercises, 97530- Therapeutic activity, [id number]- Neuromuscular re-education, 97535- Self Care, [id number]- Manual therapy, [id number]- Gait training, [phone]- Orthotic Initial, [phone]- Orthotic/Prosthetic subsequent, H9716- Electrical stimulation (unattended), [id number]- Ultrasound, 79439 (1-2 muscles), 20561 (3+ muscles)- Dry Needling, Patient/Family education,  Balance training, Stair training, Taping, Joint mobilization, Cryotherapy, and Moist heat  PLAN FOR NEXT SESSION: Initiate core, hip and knee strengthening. Work on standing postural stability and weight bearing. Reduce foot/ankle motion as  this is counterintuitive to the use of exosym. Gait training, wean to 1 a/d with amb as able.    Dagmawi Venable April Ma L Abdallah Hern, PT, DPT 01/07/2025, 12:52 PM  "

## 2025-01-10 ENCOUNTER — Ambulatory Visit

## 2025-01-16 ENCOUNTER — Ambulatory Visit: Payer: Self-pay

## 2025-01-16 DIAGNOSIS — M6281 Muscle weakness (generalized): Secondary | ICD-10-CM

## 2025-01-16 DIAGNOSIS — M25571 Pain in right ankle and joints of right foot: Secondary | ICD-10-CM

## 2025-01-16 DIAGNOSIS — R2689 Other abnormalities of gait and mobility: Secondary | ICD-10-CM

## 2025-01-16 DIAGNOSIS — R29818 Other symptoms and signs involving the nervous system: Secondary | ICD-10-CM

## 2025-01-16 DIAGNOSIS — R29898 Other symptoms and signs involving the musculoskeletal system: Secondary | ICD-10-CM

## 2025-01-16 NOTE — Therapy (Signed)
 " OUTPATIENT PHYSICAL THERAPY TREATMENT   Patient Name: Brendan Ponce MRN: [id number] DOB:11-27-1981, 44 y.o., male Today's Date: 01/16/2025  END OF SESSION:  PT End of Session - 01/16/25 0849     Visit Number 4    Number of Visits 18    Date for Recertification  02/19/25    Authorization Type Worker's Comp    PT Start Time 0845    PT Stop Time 0929    PT Time Calculation (min) 44 min    Activity Tolerance Patient tolerated treatment well            Past Medical History:  Diagnosis Date   Anxiety    Asthma    Past Surgical History:  Procedure Laterality Date   FOOT SURGERY Right    TEE WITHOUT CARDIOVERSION N/A 12/26/2024   Procedure: ECHOCARDIOGRAM, TRANSESOPHAGEAL;  Surgeon: Stacia Diannah SQUIBB, MD;  Location: [facility];  Service: Cardiovascular;  Laterality: N/A;   Patient Active Problem List   Diagnosis Date Noted   Abnormal echocardiogram 12/26/2024   Asthma exacerbation 12/23/2024   Acute respiratory failure with hypoxia (HCC) 12/23/2024   Mild persistent asthma without complication 01/15/2022   Generalized anxiety disorder 01/15/2022   Panic attacks 01/15/2022   Essential tremor 01/15/2022   Injury of right foot 01/15/2022   Microscopic hematuria 12/06/2016   Proteinuria 12/06/2016    PCP: Joesph Annabella HERO, FNP  REFERRING PROVIDER: Joesph Annabella HERO, FNP  REFERRING DIAG: [id number] (ICD-10-CM) - History of foot surgery G90.521 (ICD-10-CM) - Complex regional pain syndrome i of right lower limb  THERAPY DIAG:  Muscle weakness (generalized)  Other abnormalities of gait and mobility  Other symptoms and signs involving the nervous system  Pain in right ankle and joints of right foot  Other symptoms and signs involving the musculoskeletal system  Rationale for Evaluation and Treatment: Rehabilitation  ONSET DATE: 3.5 years s/p Right foot crush injury with I&D, pinning of the fourth metatarsal, degloving injury plantar foot    SUBJECTIVE:    SUBJECTIVE STATEMENT: Pt states he was in the hospital for 4 days after an asthma attack. Reports he was sore for maybe 2 days after last session.   From eval: Just got exosym brace <1 month ago. Did 1 week of quick training with PT. Wanted him to continue to keep using brace. Brace has been helping with his step and his pain but still gets pain in the arch. Uses lofstrand crutches when not using brace but otherwise does use w/c in the house. Uses hiking poles with the brace -- feels wobbly without any a/d. Has gone to [facility] in [place] and tried dry needling, and TENS initially for the CRPS. Reports he does have some ankle movement and pain mostly in the medial foot. Barely able to move toes. Did get email attachment from [facility] clinic about the brace.   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 Yes  PRECAUTIONS: Fall  RED FLAGS: None   WEIGHT BEARING RESTRICTIONS: No  FALLS:  Has patient fallen in last 6 months? No  LIVING ENVIRONMENT: Lives with: lives with their spouse Lives in: House/apartment Stairs: 5-6 steps to get in the house but rest of the house is one level Has following equipment at home: Crutches, Wheelchair (manual), and hiking poles  OCCUPATION: Not working currently  PLOF: Independent with basic ADLs  PATIENT GOALS: Improve movement, return to recreational activities, improve strength, less difficulty with home activities  NEXT  MD VISIT: in 6 weeks  OBJECTIVE:  Note: Objective measures were completed at Evaluation unless otherwise noted.  DIAGNOSTIC FINDINGS: Nothing recent  PATIENT SURVEYS:  LEFS  Extreme difficulty/unable (0), Quite a bit of difficulty (1), Moderate difficulty (2), Little difficulty (3), No difficulty (4) Survey date:  12/11/24  Any of your usual work, housework or school activities 1  2. Usual hobbies, recreational or sporting activities 1  3. Getting into/out of the  bath 2  4. Walking between rooms 1  5. Putting on socks/shoes 2  6. Squatting  1  7. Lifting an object, like a bag of groceries from the floor 1  8. Performing light activities around your home 1  9. Performing heavy activities around your home 0  10. Getting into/out of a car 1  11. Walking 2 blocks 0  12. Walking 1 mile 0  13. Going up/down 10 stairs (1 flight) 0  14. Standing for 1 hour 0  15.  sitting for 1 hour 4  16. Running on even ground 0  17. Running on uneven ground 0  18. Making sharp turns while running fast 0  19. Hopping  0  20. Rolling over in bed 4  Score total:  19     COGNITION: Overall cognitive status: Within functional limits for tasks assessed     SENSATION: Impaired -- sensitive to all pressure with increased pain  EDEMA:  None noted today  MUSCLE LENGTH: Did not assess  POSTURE: No Significant postural limitations  PALPATION: Did not test  LOWER EXTREMITY ROM: Limited R foot/ankle movement   LOWER EXTREMITY MMT:  MMT Right eval Left eval  Hip flexion 3+ 4  Hip extension 3 4-  Hip abduction 3 4-  Hip adduction    Hip internal rotation 4- 5  Hip external rotation 3+ 5  Knee flexion 5 5  Knee extension 4- 5  Ankle dorsiflexion    Ankle plantarflexion    Ankle inversion    Ankle eversion     (Blank rows = not tested)  LOWER EXTREMITY SPECIAL TESTS:  Did not assess  FUNCTIONAL TESTS:  Unable to perform tandem stance 1/2 tandem stance: 16.01 sec 30 sec sit to stand: 8 mostly towards L LE TUG: 15.3 sec, 14.43 sec, 14.44 sec; using UEs to push off chair and 2 hiking poles  GAIT: Distance walked: Into clinic Assistive device utilized: 2 hiking poles and exosym brace donned on L Level of assistance: Modified independence Comments: Antalgic, diminished heel strike and knee extension -- tends to amb with more a toe off pattern on R                                                                                                                                 TREATMENT DATE:   01/16/25  EXERCISE LOG  Exercise Repetitions and Resistance Comments  Nustep Lvl 4 x 15 mins   Weight Shifts 3 mins   LAQs 4# x 25 reps bil   Seated Marches 4# x 25 reps bil   Seated Hip Abduction Green x 30 reps    Seated Hip Adduction 3 mins   Seated Ham Curls Green x 30 reps bil    Blank cell = exercise not performed today   01/01/25 Nustep L4 x 10 min LEs only Supine deadbug alternating UE/LEs with pball 2x10 Supine bridge, feet on pball, knees extended 2x10x5 Sidelying hip abduction 3x10 Quadruped academic librarian with hip hike 2x10 Quadruped hip ext 2x10 Quad lean backs in tall kneeling 2x10  12/17/24 Nustep L4 x 8 min LEs only Supine PPT x10 Supine bridge 2x10x5 Supine deadbug 10x5 with pball squeeze LTR x1 min Supine Piriformis stretch x30 Sidelying hip abduction 2 sets to fatigue Prone hip extension 2 sets to fatigue Modified downward dog with alternating knee ext x5 attempted but too painful for pt Standing against wall terminal knee ext from small range knee flexion x10   PATIENT EDUCATION:  Education details: Exam findings, POC Person educated: Patient Education method: Medical Illustrator Education comprehension: verbalized understanding, returned demonstration, and needs further education  HOME EXERCISE PROGRAM: Access Code: [email or web address] URL: [email or web address] Date: 12/17/2024 Prepared by: Gellen April Earnie Starring  Exercises - Supine Bridge  - 1 x daily - 7 x weekly - 2 sets - 10 reps - Isometric Dead Bug  - 1 x daily - 7 x weekly - 10 reps - 5 sec hold - Beginner Side Leg Lift  - 1 x daily - 7 x weekly - 2 sets - 10 reps - Beginner Prone Single Leg Raise  - 1 x daily - 7 x weekly - 2 sets - 10 reps  ASSESSMENT:  CLINICAL IMPRESSION: Pt arrives for today's treatment session reporting 6/10 right ankle/foot pain.  Pt states that he has been having  issues with his asthma as of late.  Pt introduced to standing weight shifts today with emphasis on even weight bearing through each LE.  Pt introduced to seated BLE exercises to increase strength and function.  Pt given green tband for home use.  Pt denied any change in pain at completion of today's treatment session.  OBJECTIVE IMPAIRMENTS: Abnormal gait, decreased activity tolerance, decreased balance, decreased coordination, decreased endurance, decreased mobility, difficulty walking, decreased ROM, decreased strength, increased fascial restrictions, impaired sensation, improper body mechanics, postural dysfunction, and pain.   ACTIVITY LIMITATIONS: carrying, lifting, bending, standing, squatting, stairs, transfers, locomotion level, and caring for others  PARTICIPATION LIMITATIONS: cleaning, laundry, shopping, community activity, and yard work  PERSONAL FACTORS: Age, Fitness, Past/current experiences, and Time since onset of injury/illness/exacerbation are also affecting patient's functional outcome.   REHAB POTENTIAL: Good  CLINICAL DECISION MAKING: Evolving/moderate complexity  EVALUATION COMPLEXITY: Moderate   GOALS: Goals reviewed with patient? Yes  SHORT TERM GOALS: Target date: 01/08/2025  Pt will be ind with initial HEP Baseline: Goal status: INITIAL  2.  Pt will be able to perform tandem stance x 10 sec to demo increasing R LE weightbearing and stability Baseline:  Goal status: INITIAL  3.  Pt will be able to amb with only one walking aid for stability x 400' for home distances and normal reciprocal pattern Baseline:  Goal status: INITIAL  4.  Pt will be able to perform at least 15 sit to stands in 30 sec to demo increasing functional  LE strength and endurance Baseline:  Goal status: INITIAL    LONG TERM GOALS: Target date: 02/05/2025   Pt will be ind with management and progression of HEP Baseline:  Goal status: INITIAL  2.  Pt will be able to amb  independently x1000' with LRAD Baseline:  Goal status: INITIAL  3.  Pt will have improved 30 sec sit to stand to at least 16 to be within his age norms (age norm of 79.2 with a 6.3 standard deviation) Baseline:  Goal status: INITIAL  4.  Pt will have improved TUG to </=10 sec with LRAD Baseline: 14.43 sec Goal status: INITIAL  5.  Pt will have improved tandem stance to at least 20 sec to demo increased standing stability and balance Baseline:  Goal status: INITIAL  6.  Pt will have improved LEFS to >/=31 Baseline:  Goal status: INITIAL   PLAN:  PT FREQUENCY: 18 visits per Worker's Comp  PT DURATION: 6-8 weeks  PLANNED INTERVENTIONS: 97164- PT Re-evaluation, 97750- Physical Performance Testing, 97110-Therapeutic exercises, 97530- Therapeutic activity, [id number]- Neuromuscular re-education, 97535- Self Care, 02859- Manual therapy, [id number]- Gait training, [phone]- Orthotic Initial, [phone]- Orthotic/Prosthetic subsequent, H9716- Electrical stimulation (unattended), [id number]- Ultrasound, 79439 (1-2 muscles), 20561 (3+ muscles)- Dry Needling, Patient/Family education, Balance training, Stair training, Taping, Joint mobilization, Cryotherapy, and Moist heat  PLAN FOR NEXT SESSION: Initiate core, hip and knee strengthening. Work on standing postural stability and weight bearing. Reduce foot/ankle motion as this is counterintuitive to the use of exosym. Gait training, wean to 1 a/d with amb as able.    Delon DELENA Gosling, PTA 01/16/2025, 9:43 AM  "

## 2025-01-22 ENCOUNTER — Ambulatory Visit: Payer: Worker's Compensation | Admitting: Physical Therapy

## 2025-01-22 ENCOUNTER — Encounter: Payer: Self-pay | Admitting: Physical Therapy

## 2025-01-22 DIAGNOSIS — R29898 Other symptoms and signs involving the musculoskeletal system: Secondary | ICD-10-CM

## 2025-01-22 DIAGNOSIS — M25571 Pain in right ankle and joints of right foot: Secondary | ICD-10-CM

## 2025-01-22 DIAGNOSIS — R29818 Other symptoms and signs involving the nervous system: Secondary | ICD-10-CM

## 2025-01-22 DIAGNOSIS — R2689 Other abnormalities of gait and mobility: Secondary | ICD-10-CM

## 2025-01-22 DIAGNOSIS — M6281 Muscle weakness (generalized): Secondary | ICD-10-CM | POA: Diagnosis not present

## 2025-01-22 NOTE — Therapy (Signed)
 " OUTPATIENT PHYSICAL THERAPY TREATMENT   Patient Name: Brendan Ponce MRN: [id number] DOB:May 24, 1981, 44 y.o., male Today's Date: 01/22/2025  END OF SESSION:  PT End of Session - 01/22/25 1017     Visit Number 5    Number of Visits 18    Date for Recertification  02/19/25    Authorization Type Worker's Comp    PT Start Time 1011    PT Stop Time 1055    PT Time Calculation (min) 44 min    Activity Tolerance Patient tolerated treatment well            Past Medical History:  Diagnosis Date   Anxiety    Asthma    Past Surgical History:  Procedure Laterality Date   FOOT SURGERY Right    TEE WITHOUT CARDIOVERSION N/A 12/26/2024   Procedure: ECHOCARDIOGRAM, TRANSESOPHAGEAL;  Surgeon: Stacia Diannah SQUIBB, MD;  Location: [facility];  Service: Cardiovascular;  Laterality: N/A;   Patient Active Problem List   Diagnosis Date Noted   Abnormal echocardiogram 12/26/2024   Asthma exacerbation 12/23/2024   Acute respiratory failure with hypoxia (HCC) 12/23/2024   Mild persistent asthma without complication 01/15/2022   Generalized anxiety disorder 01/15/2022   Panic attacks 01/15/2022   Essential tremor 01/15/2022   Injury of right foot 01/15/2022   Microscopic hematuria 12/06/2016   Proteinuria 12/06/2016    PCP: Joesph Annabella HERO, FNP  REFERRING PROVIDER: Joesph Annabella HERO, FNP  REFERRING DIAG: [phone] (ICD-10-CM) - History of foot surgery G90.521 (ICD-10-CM) - Complex regional pain syndrome i of right lower limb  THERAPY DIAG:  Muscle weakness (generalized)  Other abnormalities of gait and mobility  Other symptoms and signs involving the nervous system  Pain in right ankle and joints of right foot  Other symptoms and signs involving the musculoskeletal system  Rationale for Evaluation and Treatment: Rehabilitation  ONSET DATE: 3.5 years s/p Right foot crush injury with I&D, pinning of the fourth metatarsal, degloving injury plantar foot    SUBJECTIVE:    SUBJECTIVE STATEMENT: Pt states he was in the hospital for 4 days after an asthma attack. Reports he was sore for maybe 2 days after last session.   From eval: Just got exosym brace <1 month ago. Did 1 week of quick training with PT. Wanted him to continue to keep using brace. Brace has been helping with his step and his pain but still gets pain in the arch. Uses lofstrand crutches when not using brace but otherwise does use w/c in the house. Uses hiking poles with the brace -- feels wobbly without any a/d. Has gone to [facility] in [place] and tried dry needling, and TENS initially for the CRPS. Reports he does have some ankle movement and pain mostly in the medial foot. Barely able to move toes. Did get email attachment from [facility] clinic about the brace.   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 Yes  PRECAUTIONS: Fall  RED FLAGS: None   WEIGHT BEARING RESTRICTIONS: No  FALLS:  Has patient fallen in last 6 months? No  LIVING ENVIRONMENT: Lives with: lives with their spouse Lives in: House/apartment Stairs: 5-6 steps to get in the house but rest of the house is one level Has following equipment at home: Crutches, Wheelchair (manual), and hiking poles  OCCUPATION: Not working currently  PLOF: Independent with basic ADLs  PATIENT GOALS: Improve movement, return to recreational activities, improve strength, less difficulty with home activities  NEXT  MD VISIT: in 6 weeks  OBJECTIVE:  Note: Objective measures were completed at Evaluation unless otherwise noted.  DIAGNOSTIC FINDINGS: Nothing recent  PATIENT SURVEYS:  LEFS  Extreme difficulty/unable (0), Quite a bit of difficulty (1), Moderate difficulty (2), Little difficulty (3), No difficulty (4) Survey date:  12/11/24  Any of your usual work, housework or school activities 1  2. Usual hobbies, recreational or sporting activities 1  3. Getting into/out of the  bath 2  4. Walking between rooms 1  5. Putting on socks/shoes 2  6. Squatting  1  7. Lifting an object, like a bag of groceries from the floor 1  8. Performing light activities around your home 1  9. Performing heavy activities around your home 0  10. Getting into/out of a car 1  11. Walking 2 blocks 0  12. Walking 1 mile 0  13. Going up/down 10 stairs (1 flight) 0  14. Standing for 1 hour 0  15.  sitting for 1 hour 4  16. Running on even ground 0  17. Running on uneven ground 0  18. Making sharp turns while running fast 0  19. Hopping  0  20. Rolling over in bed 4  Score total:  19     COGNITION: Overall cognitive status: Within functional limits for tasks assessed     SENSATION: Impaired -- sensitive to all pressure with increased pain  EDEMA:  None noted today  MUSCLE LENGTH: Did not assess  POSTURE: No Significant postural limitations  PALPATION: Did not test  LOWER EXTREMITY ROM: Limited R foot/ankle movement   LOWER EXTREMITY MMT:  MMT Right eval Left eval  Hip flexion 3+ 4  Hip extension 3 4-  Hip abduction 3 4-  Hip adduction    Hip internal rotation 4- 5  Hip external rotation 3+ 5  Knee flexion 5 5  Knee extension 4- 5  Ankle dorsiflexion    Ankle plantarflexion    Ankle inversion    Ankle eversion     (Blank rows = not tested)  LOWER EXTREMITY SPECIAL TESTS:  Did not assess  FUNCTIONAL TESTS:  Unable to perform tandem stance 1/2 tandem stance: 16.01 sec 30 sec sit to stand: 8 mostly towards L LE TUG: 15.3 sec, 14.43 sec, 14.44 sec; using UEs to push off chair and 2 hiking poles  GAIT: Distance walked: Into clinic Assistive device utilized: 2 hiking poles and exosym brace donned on L Level of assistance: Modified independence Comments: Antalgic, diminished heel strike and knee extension -- tends to amb with more a toe off pattern on R                                                                                                                                 TREATMENT DATE:  01/22/25 Supine bridge, feet on pball, knees extended 2x10x5 Supine single leg bridge knees extended 10x5 Supine bridge feet on pball knees bent 2x10  Tall kneeling quad lean backs 2x10 Tall kneeling into forward planks using pball 2x10 R knee on plinth, 1 UE performing hip hike x10, no UE support x10 Against wall Awilda morgan/glute med setting 2x5 (limited due to pain)   01/16/25                                  EXERCISE LOG  Exercise Repetitions and Resistance Comments  Nustep Lvl 4 x 15 mins   Weight Shifts 3 mins   LAQs 4# x 25 reps bil   Seated Marches 4# x 25 reps bil   Seated Hip Abduction Green x 30 reps    Seated Hip Adduction 3 mins   Seated Ham Curls Green x 30 reps bil    Blank cell = exercise not performed today   01/01/25 Nustep L4 x 10 min LEs only Supine deadbug alternating UE/LEs with pball 2x10 Supine bridge, feet on pball, knees extended 2x10x5 Sidelying hip abduction 3x10 Quadruped academic librarian with hip hike 2x10 Quadruped hip ext 2x10 Quad lean backs in tall kneeling 2x10   PATIENT EDUCATION:  Education details: Exam findings, POC Person educated: Patient Education method: Medical Illustrator Education comprehension: verbalized understanding, returned demonstration, and needs further education  HOME EXERCISE PROGRAM: Access Code: [email or web address] URL: [email or web address] Date: 12/17/2024 Prepared by: Janei Scheff April Earnie Starring  Exercises - Supine Bridge  - 1 x daily - 7 x weekly - 2 sets - 10 reps - Isometric Dead Bug  - 1 x daily - 7 x weekly - 10 reps - 5 sec hold - Beginner Side Leg Lift  - 1 x daily - 7 x weekly - 2 sets - 10 reps - Beginner Prone Single Leg Raise  - 1 x daily - 7 x weekly - 2 sets - 10 reps  ASSESSMENT:  CLINICAL IMPRESSION: Remains limited in his ability to weight bear on R foot due to pain -- needs to get brace adjusted due to pressure sore developing and  continued arch pain. Continued to progress single leg strength and core/trunk stability.   OBJECTIVE IMPAIRMENTS: Abnormal gait, decreased activity tolerance, decreased balance, decreased coordination, decreased endurance, decreased mobility, difficulty walking, decreased ROM, decreased strength, increased fascial restrictions, impaired sensation, improper body mechanics, postural dysfunction, and pain.   ACTIVITY LIMITATIONS: carrying, lifting, bending, standing, squatting, stairs, transfers, locomotion level, and caring for others  PARTICIPATION LIMITATIONS: cleaning, laundry, shopping, community activity, and yard work  PERSONAL FACTORS: Age, Fitness, Past/current experiences, and Time since onset of injury/illness/exacerbation are also affecting patient's functional outcome.   REHAB POTENTIAL: Good  CLINICAL DECISION MAKING: Evolving/moderate complexity  EVALUATION COMPLEXITY: Moderate   GOALS: Goals reviewed with patient? Yes  SHORT TERM GOALS: Target date: 01/08/2025  Pt will be ind with initial HEP Baseline: Goal status: INITIAL  2.  Pt will be able to perform tandem stance x 10 sec to demo increasing R LE weightbearing and stability Baseline:  Goal status: INITIAL  3.  Pt will be able to amb with only one walking aid for stability x 400' for home distances and normal reciprocal pattern Baseline:  Goal status: INITIAL  4.  Pt will be able to perform at least 15 sit to stands in 30 sec to demo increasing functional LE strength and endurance Baseline:  Goal status: INITIAL    LONG TERM GOALS: Target date: 02/05/2025   Pt will be ind with management and progression of HEP  Baseline:  Goal status: INITIAL  2.  Pt will be able to amb independently x1000' with LRAD Baseline:  Goal status: INITIAL  3.  Pt will have improved 30 sec sit to stand to at least 16 to be within his age norms (age norm of 50.2 with a 6.3 standard deviation) Baseline:  Goal status:  INITIAL  4.  Pt will have improved TUG to </=10 sec with LRAD Baseline: 14.43 sec Goal status: INITIAL  5.  Pt will have improved tandem stance to at least 20 sec to demo increased standing stability and balance Baseline:  Goal status: INITIAL  6.  Pt will have improved LEFS to >/=31 Baseline:  Goal status: INITIAL   PLAN:  PT FREQUENCY: 18 visits per Worker's Comp  PT DURATION: 6-8 weeks  PLANNED INTERVENTIONS: 97164- PT Re-evaluation, 97750- Physical Performance Testing, 97110-Therapeutic exercises, 97530- Therapeutic activity, [id number]- Neuromuscular re-education, 97535- Self Care, 02859- Manual therapy, [id number]- Gait training, [id number]- Orthotic Initial, [phone]- Orthotic/Prosthetic subsequent, H9716- Electrical stimulation (unattended), [id number]- Ultrasound, 79439 (1-2 muscles), 20561 (3+ muscles)- Dry Needling, Patient/Family education, Balance training, Stair training, Taping, Joint mobilization, Cryotherapy, and Moist heat  PLAN FOR NEXT SESSION: Initiate core, hip and knee strengthening. Work on standing postural stability and weight bearing. Reduce foot/ankle motion as this is counterintuitive to the use of exosym. Gait training, wean to 1 a/d with amb as able.    Kacen Mellinger April Ma L Denette Hass, PT, DPT 01/22/2025, 10:18 AM  "

## 2025-01-24 ENCOUNTER — Ambulatory Visit: Payer: Self-pay | Admitting: Physical Therapy

## 2025-01-25 ENCOUNTER — Ambulatory Visit: Attending: Adult Health

## 2025-01-25 DIAGNOSIS — R2689 Other abnormalities of gait and mobility: Secondary | ICD-10-CM | POA: Insufficient documentation

## 2025-01-25 DIAGNOSIS — R29818 Other symptoms and signs involving the nervous system: Secondary | ICD-10-CM | POA: Insufficient documentation

## 2025-01-25 DIAGNOSIS — M6281 Muscle weakness (generalized): Secondary | ICD-10-CM | POA: Diagnosis present

## 2025-01-25 DIAGNOSIS — M25571 Pain in right ankle and joints of right foot: Secondary | ICD-10-CM | POA: Diagnosis present

## 2025-01-25 NOTE — Therapy (Signed)
 " OUTPATIENT PHYSICAL THERAPY TREATMENT   Patient Name: Brendan Ponce MRN: [id number] DOB:[birth year], 44 y.o., male Today's Date: 01/25/2025  END OF SESSION:  PT End of Session - 01/25/25 0936     Visit Number 6    Number of Visits 18    Date for Recertification  02/19/25    Authorization Type Worker's Comp    PT Start Time 0930    PT Stop Time 1012    PT Time Calculation (min) 42 min    Activity Tolerance Patient tolerated treatment well            Past Medical History:  Diagnosis Date   Anxiety    Asthma    Past Surgical History:  Procedure Laterality Date   FOOT SURGERY Right    TEE WITHOUT CARDIOVERSION N/A 12/26/2024   Procedure: ECHOCARDIOGRAM, TRANSESOPHAGEAL;  Surgeon: Stacia Diannah SQUIBB, MD;  Location: [facility];  Service: Cardiovascular;  Laterality: N/A;   Patient Active Problem List   Diagnosis Date Noted   Abnormal echocardiogram 12/26/2024   Asthma exacerbation 12/23/2024   Acute respiratory failure with hypoxia (HCC) 12/23/2024   Mild persistent asthma without complication 01/15/2022   Generalized anxiety disorder 01/15/2022   Panic attacks 01/15/2022   Essential tremor 01/15/2022   Injury of right foot 01/15/2022   Microscopic hematuria 12/06/2016   Proteinuria 12/06/2016    PCP: Joesph Annabella HERO, FNP  REFERRING PROVIDER: Joesph Annabella HERO, FNP  REFERRING DIAG: [phone] (ICD-10-CM) - History of foot surgery G90.521 (ICD-10-CM) - Complex regional pain syndrome i of right lower limb  THERAPY DIAG:  Muscle weakness (generalized)  Other abnormalities of gait and mobility  Other symptoms and signs involving the nervous system  Pain in right ankle and joints of right foot  Rationale for Evaluation and Treatment: Rehabilitation  ONSET DATE: 3.5 years s/p Right foot crush injury with I&D, pinning of the fourth metatarsal, degloving injury plantar foot    SUBJECTIVE:   SUBJECTIVE STATEMENT: Pt reports 7/10 right ankle pain today.    From  eval: Just got exosym brace <1 month ago. Did 1 week of quick training with PT. Wanted him to continue to keep using brace. Brace has been helping with his step and his pain but still gets pain in the arch. Uses lofstrand crutches when not using brace but otherwise does use w/c in the house. Uses hiking poles with the brace -- feels wobbly without any a/d. Has gone to [facility] in [place] and tried dry needling, and TENS initially for the CRPS. Reports he does have some ankle movement and pain mostly in the medial foot. Barely able to move toes. Did get email attachment from [facility] about the brace.   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 Yes  PRECAUTIONS: Fall  RED FLAGS: None   WEIGHT BEARING RESTRICTIONS: No  FALLS:  Has patient fallen in last 6 months? No  LIVING ENVIRONMENT: Lives with: lives with their spouse Lives in: House/apartment Stairs: 5-6 steps to get in the house but rest of the house is one level Has following equipment at home: Crutches, Wheelchair (manual), and hiking poles  OCCUPATION: Not working currently  PLOF: Independent with basic ADLs  PATIENT GOALS: Improve movement, return to recreational activities, improve strength, less difficulty with home activities  NEXT MD VISIT: in 6 weeks  OBJECTIVE:  Note: Objective measures were completed at Evaluation unless otherwise noted.  DIAGNOSTIC FINDINGS: Nothing recent  PATIENT SURVEYS:  LEFS  Extreme difficulty/unable (0), Quite a bit of difficulty (1), Moderate difficulty (2), Little difficulty (3), No difficulty (4) Survey date:  12/11/24  Any of your usual work, housework or school activities 1  2. Usual hobbies, recreational or sporting activities 1  3. Getting into/out of the bath 2  4. Walking between rooms 1  5. Putting on socks/shoes 2  6. Squatting  1  7. Lifting an object, like a bag of groceries from the floor 1  8. Performing  light activities around your home 1  9. Performing heavy activities around your home 0  10. Getting into/out of a car 1  11. Walking 2 blocks 0  12. Walking 1 mile 0  13. Going up/down 10 stairs (1 flight) 0  14. Standing for 1 hour 0  15.  sitting for 1 hour 4  16. Running on even ground 0  17. Running on uneven ground 0  18. Making sharp turns while running fast 0  19. Hopping  0  20. Rolling over in bed 4  Score total:  19     COGNITION: Overall cognitive status: Within functional limits for tasks assessed     SENSATION: Impaired -- sensitive to all pressure with increased pain  EDEMA:  None noted today  MUSCLE LENGTH: Did not assess  POSTURE: No Significant postural limitations  PALPATION: Did not test  LOWER EXTREMITY ROM: Limited R foot/ankle movement   LOWER EXTREMITY MMT:  MMT Right eval Left eval  Hip flexion 3+ 4  Hip extension 3 4-  Hip abduction 3 4-  Hip adduction    Hip internal rotation 4- 5  Hip external rotation 3+ 5  Knee flexion 5 5  Knee extension 4- 5  Ankle dorsiflexion    Ankle plantarflexion    Ankle inversion    Ankle eversion     (Blank rows = not tested)  LOWER EXTREMITY SPECIAL TESTS:  Did not assess  FUNCTIONAL TESTS:  Unable to perform tandem stance 1/2 tandem stance: 16.01 sec 30 sec sit to stand: 8 mostly towards L LE TUG: 15.3 sec, 14.43 sec, 14.44 sec; using UEs to push off chair and 2 hiking poles  GAIT: Distance walked: Into clinic Assistive device utilized: 2 hiking poles and exosym brace donned on L Level of assistance: Modified independence Comments: Antalgic, diminished heel strike and knee extension -- tends to amb with more a toe off pattern on R                                                                                                                                TREATMENT DATE:   01/25/25                                  EXERCISE LOG  Exercise Repetitions and Resistance Comments  Nustep Lvl 4  x 15 mins  Weight Shifts    LAQs 4# x 30 reps bil   Seated Marches 4# x 30 reps bil   Seated Hip Abduction Green x 30 reps    Seated Hip Adduction 3 mins   Seated Ham Curls Green x 30 reps bil   STS 10 reps concentration for weight bearing    Blank cell = exercise not performed today   01/22/25 Supine bridge, feet on pball, knees extended 2x10x5 Supine single leg bridge knees extended 10x5 Supine bridge feet on pball knees bent 2x10 Tall kneeling quad lean backs 2x10 Tall kneeling into forward planks using pball 2x10 R knee on plinth, 1 UE performing hip hike x10, no UE support x10 Against wall Awilda morgan/glute med setting 2x5 (limited due to pain)   01/16/25                                  EXERCISE LOG  Exercise Repetitions and Resistance Comments  Nustep Lvl 4 x 15 mins   Weight Shifts 3 mins   LAQs 4# x 25 reps bil   Seated Marches 4# x 25 reps bil   Seated Hip Abduction Green x 30 reps    Seated Hip Adduction 3 mins   Seated Ham Curls Green x 30 reps bil    Blank cell = exercise not performed today     PATIENT EDUCATION:  Education details: Exam findings, POC Person educated: Patient Education method: Medical Illustrator Education comprehension: verbalized understanding, returned demonstration, and needs further education  HOME EXERCISE PROGRAM: Access Code: [email or web address] URL: [email or web address] Date: 12/17/2024 Prepared by: Gellen April Earnie Starring  Exercises - Supine Bridge  - 1 x daily - 7 x weekly - 2 sets - 10 reps - Isometric Dead Bug  - 1 x daily - 7 x weekly - 10 reps - 5 sec hold - Beginner Side Leg Lift  - 1 x daily - 7 x weekly - 2 sets - 10 reps - Beginner Prone Single Leg Raise  - 1 x daily - 7 x weekly - 2 sets - 10 reps  ASSESSMENT:  CLINICAL IMPRESSION: Pt arrives for today's treatment session reporting 7/10 right ankle pain.  Pt continues to be limited by pain.  Pt has discussed brace adjustment with MD.  Pt is  waiting to find a box big enough to send his brace off to [facility].  Pt challenged by STS transfers with tendency to put more weight through left LE.  Pt able to even out weight bearing with cues.  Pt denied any change in pain at completion of today's treatment session.  Pt encouraged to increased weight bearing as tolerated.    OBJECTIVE IMPAIRMENTS: Abnormal gait, decreased activity tolerance, decreased balance, decreased coordination, decreased endurance, decreased mobility, difficulty walking, decreased ROM, decreased strength, increased fascial restrictions, impaired sensation, improper body mechanics, postural dysfunction, and pain.   ACTIVITY LIMITATIONS: carrying, lifting, bending, standing, squatting, stairs, transfers, locomotion level, and caring for others  PARTICIPATION LIMITATIONS: cleaning, laundry, shopping, community activity, and yard work  PERSONAL FACTORS: Age, Fitness, Past/current experiences, and Time since onset of injury/illness/exacerbation are also affecting patient's functional outcome.   REHAB POTENTIAL: Good  CLINICAL DECISION MAKING: Evolving/moderate complexity  EVALUATION COMPLEXITY: Moderate   GOALS: Goals reviewed with patient? Yes  SHORT TERM GOALS: Target date: 01/08/2025  Pt will be ind with initial HEP Baseline: Goal status: IN PROGRESS  2.  Pt will be able  to perform tandem stance x 10 sec to demo increasing R LE weightbearing and stability Baseline:  Goal status: IN PROGRESS  3.  Pt will be able to amb with only one walking aid for stability x 400' for home distances and normal reciprocal pattern Baseline:  Goal status: IN PROGRESS  4.  Pt will be able to perform at least 15 sit to stands in 30 sec to demo increasing functional LE strength and endurance Baseline: Goal status: IN PROGRESS    LONG TERM GOALS: Target date: 02/05/2025   Pt will be ind with management and progression of HEP Baseline:  Goal status: IN PROGRESS  2.  Pt  will be able to amb independently x1000' with LRAD Baseline:  Goal status: IN PROGRESS  3.  Pt will have improved 30 sec sit to stand to at least 18 to be within his age norms (age norm of 32.2 with a 6.3 standard deviation) Baseline:  Goal status: IN PROGRESS  4.  Pt will have improved TUG to </=10 sec with LRAD Baseline: 14.43 sec Goal status: IN PROGRESS  5.  Pt will have improved tandem stance to at least 20 sec to demo increased standing stability and balance Baseline:  Goal status: IN PROGRESS  6.  Pt will have improved LEFS to >/=31 Baseline:  Goal status: IN PROGRESS   PLAN:  PT FREQUENCY: 18 visits per Worker's Comp  PT DURATION: 6-8 weeks  PLANNED INTERVENTIONS: 97164- PT Re-evaluation, 97750- Physical Performance Testing, 97110-Therapeutic exercises, 97530- Therapeutic activity, [id number]- Neuromuscular re-education, 97535- Self Care, 02859- Manual therapy, [id number]- Gait training, [phone]- Orthotic Initial, [phone]- Orthotic/Prosthetic subsequent, H9716- Electrical stimulation (unattended), [id number]- Ultrasound, 79439 (1-2 muscles), 20561 (3+ muscles)- Dry Needling, Patient/Family education, Balance training, Stair training, Taping, Joint mobilization, Cryotherapy, and Moist heat  PLAN FOR NEXT SESSION: Initiate core, hip and knee strengthening. Work on standing postural stability and weight bearing. Reduce foot/ankle motion as this is counterintuitive to the use of exosym. Gait training, wean to 1 a/d with amb as able.    Delon DELENA Gosling, PTA 01/25/2025, 10:38 AM  "

## 2025-01-28 ENCOUNTER — Ambulatory Visit: Payer: Self-pay

## 2025-01-29 ENCOUNTER — Ambulatory Visit: Admitting: Internal Medicine

## 2025-01-30 ENCOUNTER — Encounter: Payer: Self-pay | Admitting: Pulmonary Disease

## 2025-01-30 ENCOUNTER — Ambulatory Visit: Admitting: Pulmonary Disease

## 2025-01-30 VITALS — BP 124/82 | HR 64 | Temp 98.6°F | Ht 74.0 in | Wt 230.0 lb

## 2025-01-30 DIAGNOSIS — J45901 Unspecified asthma with (acute) exacerbation: Secondary | ICD-10-CM

## 2025-01-30 LAB — CBC WITH DIFFERENTIAL/PLATELET
Basophils Absolute: 0.1 10*3/uL (ref 0.0–0.1)
Basophils Relative: 2.4 % (ref 0.0–3.0)
Eosinophils Absolute: 0.3 10*3/uL (ref 0.0–0.7)
Eosinophils Relative: 5.6 % — ABNORMAL HIGH (ref 0.0–5.0)
HCT: 45 % (ref 39.0–52.0)
Hemoglobin: 15.5 g/dL (ref 13.0–17.0)
Lymphocytes Relative: 23.5 % (ref 12.0–46.0)
Lymphs Abs: 1.3 10*3/uL (ref 0.7–4.0)
MCHC: 34.5 g/dL (ref 30.0–36.0)
MCV: 94.4 fl (ref 78.0–100.0)
Monocytes Absolute: 0.4 10*3/uL (ref 0.1–1.0)
Monocytes Relative: 6.8 % (ref 3.0–12.0)
Neutro Abs: 3.5 10*3/uL (ref 1.4–7.7)
Neutrophils Relative %: 61.7 % (ref 43.0–77.0)
Platelets: 308 10*3/uL (ref 150.0–400.0)
RBC: 4.77 Mil/uL (ref 4.22–5.81)
RDW: 14 % (ref 11.5–15.5)
WBC: 5.6 10*3/uL (ref 4.0–10.5)

## 2025-01-30 LAB — POCT EXHALED NITRIC OXIDE: FeNO level (ppb): 39 (ref ?–50)

## 2025-01-30 MED ORDER — MONTELUKAST SODIUM 10 MG PO TABS: 10.0000 mg | ORAL_TABLET | Freq: Every day | ORAL | 3 refills | Status: AC

## 2025-01-30 MED ORDER — FLUTICASONE-SALMETEROL 500-50 MCG/ACT IN AEPB: 1.0000 | INHALATION_SPRAY | Freq: Two times a day (BID) | RESPIRATORY_TRACT | 3 refills | Status: AC

## 2025-01-30 NOTE — Patient Instructions (Signed)
" °  VISIT SUMMARY: During your visit, we discussed your recent worsening of asthma symptoms, including breathing difficulties and wheezing. We reviewed your recent hospitalization and current asthma management plan.  YOUR PLAN: MODERATE PERSISTENT ASTHMA: You have been experiencing moderate persistent asthma with recent exacerbation requiring hospitalization. Your symptoms include airway tightness, mild wheezing, and difficulty breathing. -Increase the dose of your Wixela inhaler to better manage your symptoms. -Start taking Singulair  to help reduce lung inflammation. -We have ordered blood tests, including a blood count and IgE, to check for allergies or allergic inflammation. -Be aware of signs of severe asthma exacerbation, such as inability to speak full sentences and oxygen saturation below 88%. -Consider purchasing a home oximeter to monitor your oxygen levels. -We will have a follow-up appointment in three months to assess your asthma control.    Contains text generated by Abridge.   "

## 2025-01-30 NOTE — Progress Notes (Signed)
 6              Brendan Ponce    [id number]    [birth year]  Primary Care Physician:Morgan, Annabella HERO, FNP  Referring Physician: Joesph Annabella HERO, FNP [address] [place],  [place] [address]  Chief complaint: Consult for asthma  HPI: 44 y.o. who  has a past medical history of Anxiety and Asthma.  Discussed the use of AI scribe software for clinical note transcription with the patient, who gave verbal consent to proceed.  History of Present Illness Brendan Ponce is a 44 year old male with asthma who presents with worsening breathing issues.  Dyspnea and wheezing - Persistent episodes of tight breathing at rest since hospital discharge, occurring while sitting in a car or lying in bed - Mild wheeze at the start of inspiration and occasionally on exhalation, different from prior heavier asthma wheeze - Nocturnal and daytime symptoms present  Recent hospitalization for respiratory distress - Hospitalized from December 28 to December 26, 2024, for severe breathing difficulty and chest pain after failure of rescue inhaler - Required emergency room care - Troponins, D-dimer, and chest X-ray negative for acute cardiac or embolic etiology - Treated with systemic steroids during admission  Asthma management and medication adherence - Discharged on maintenance inhaler (Wixela 100/50) and albuterol  for nebulizer use - Uses Wixela twice daily but often only once daily - Uses nebulizer at least once and sometimes twice daily  Psychiatric comorbidity - Anxiety treated with Lexapro   Functional status - On disability after a crush injury to his foot  Relevant Pulmonary history: Pets:[place] with cats, dogs, goats, chickens, and rabbits; these animals previously worsened asthma but are not strong triggers currently Occupation: Prior work as naval architect, psychologist, occupational, education administrator, psychiatric nurse, and in theatre stage manager with potential exposure to fumes and chemicals Exposures: No mold, hot tub, Jacuzzi.  No  feather pillows or comforters No h/o chemo/XRT/amiodarone/macrodantin/MTX  No exposure to asbestos, silica or other organic allergens  Smoking history:Smokes cigarettes once or twice a year, last use about six months ago. Chews tobacco Travel history: Previously lived in [place] , [place] , [place] .  No significant recent travel Family history: No family history of lung disease  Outpatient Encounter Medications as of 01/30/2025  Medication Sig   albuterol  (PROVENTIL ) (2.5 MG/3ML) 0.083% nebulizer solution Take 3 mLs (2.5 mg total) by nebulization every 4 (four) hours as needed for shortness of breath or wheezing.   albuterol  (VENTOLIN  HFA) 108 (90 Base) MCG/ACT inhaler Inhale 2 puffs into the lungs every 6 (six) hours as needed for wheezing or shortness of breath.   Albuterol -Budesonide (AIRSUPRA ) 90-80 MCG/ACT AERO Inhale 2 puffs into the lungs as needed (do not exceed 12 uses in 24 hours).   aspirin  EC 81 MG tablet Take 81 mg by mouth every evening. Swallow whole.   escitalopram  (LEXAPRO ) 20 MG tablet Take 1 tablet (20 mg total) by mouth daily.   fluticasone -salmeterol (ADVAIR) 100-50 MCG/ACT AEPB Inhale 1 puff into the lungs 2 (two) times daily.   fluticasone -salmeterol (WIXELA INHUB) 100-50 MCG/ACT AEPB Inhale 1 puff into the lungs 2 (two) times daily.   hydrOXYzine  (ATARAX ) 25 MG tablet TAKE 1 TABLET BY MOUTH 3 TIMES DAILY AS NEEDED FOR ANXIETY.   Multiple Vitamin (MULTIVITAMIN PO) Take 1 tablet by mouth daily.   NUCYNTA  75 MG tablet Take 75 mg by mouth 3 (three) times daily as needed.   pregabalin  (LYRICA ) 150 MG capsule Take 150 mg by mouth 2 (two) times  daily.   propranolol  (INDERAL ) 20 MG tablet Take 60 mg by mouth 3 (three) times daily.   tiZANidine (ZANAFLEX) 4 MG capsule Take 4 mg by mouth 3 (three) times daily.   triamcinolone  cream (KENALOG ) 0.1 % APPLY 1 APPLICATION TOPICALLY TWICE A DAY AS NEEDED   No facility-administered encounter medications on file as of 01/30/2025.      Physical Exam: Today's Vitals   01/30/25 1028  BP: 124/82  Pulse: 64  Temp: 98.6 F (37 C)  TempSrc: Oral  SpO2: 92%  Weight: 230 lb (104.3 kg)  Height: 6' 2 (1.88 m)   Body mass index is 29.53 kg/m.  Physical Exam VITALS: BP- 100/50 GEN: No acute distress CV: Regular rate and rhythm no murmurs LUNGS: Clear to auscultation bilaterally normal respiratory effort SKIN JOINTS: Warm and dry no rash    Data Reviewed: Imaging: Chest x-ray 12/25/2024-bandlike atelectasis in the right mid and left base.  I have reviewed the images personally.  PFTs:  FeNO 01/30/2025-39  Labs:  Assessment and Plan Assessment & Plan Moderate persistent asthma Recent exacerbation requiring hospitalization. Symptoms include sudden onset of airway tightness, mild wheezing, and difficulty breathing. Previous treatment with rescue inhaler was ineffective. Current treatment includes Wixela inhaler and nebulizer with albuterol . 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 No significant wheezing on examination, but lungs sound clear. No family history of lung disease. Occupational exposure to fumes and chemicals. No current smoking, but history of social smoking and tobacco chewing. Anxiety disorder managed with Lexapro . - Increased dose of Wixela inhaler to 500 - Started Singulair  to reduce lung inflammation. - Ordered blood tests including blood count and IgE to assess for allergies or allergic inflammation. - Advised on signs of severe asthma exacerbation, including inability to speak full sentences and oxygen saturation below 88%. - Recommended purchasing a home oximeter to monitor oxygen levels. - Scheduled follow-up in three months to assess asthma control.  Recommendations: Increase Wixela dose Start Singulair  CBC with differential, IgE PFTs  Lonna Coder MD [facility] 01/30/2025, 10:48 AM  CC: Joesph Annabella HERO, FNP

## 2025-01-31 ENCOUNTER — Ambulatory Visit: Payer: Self-pay | Attending: Adult Health

## 2025-01-31 DIAGNOSIS — R2689 Other abnormalities of gait and mobility: Secondary | ICD-10-CM

## 2025-01-31 DIAGNOSIS — M6281 Muscle weakness (generalized): Secondary | ICD-10-CM

## 2025-01-31 DIAGNOSIS — M25571 Pain in right ankle and joints of right foot: Secondary | ICD-10-CM

## 2025-01-31 DIAGNOSIS — R29898 Other symptoms and signs involving the musculoskeletal system: Secondary | ICD-10-CM

## 2025-01-31 DIAGNOSIS — R29818 Other symptoms and signs involving the nervous system: Secondary | ICD-10-CM

## 2025-01-31 LAB — IGE: IgE (Immunoglobulin E), Serum: 1693 kU/L — ABNORMAL HIGH

## 2025-01-31 NOTE — Therapy (Signed)
 " OUTPATIENT PHYSICAL THERAPY TREATMENT   Patient Name: Brendan Ponce MRN: [id number] DOB:02-18-1981, 44 y.o., male Today's Date: 01/31/2025  END OF SESSION:  PT End of Session - 01/31/25 1306     Visit Number 7    Number of Visits 18    Date for Recertification  02/19/25    Authorization Type Worker's Comp    PT Start Time 1303    PT Stop Time 1346    PT Time Calculation (min) 43 min    Activity Tolerance Patient tolerated treatment well            Past Medical History:  Diagnosis Date   Anxiety    Asthma    Past Surgical History:  Procedure Laterality Date   FOOT SURGERY Right    TEE WITHOUT CARDIOVERSION N/A 12/26/2024   Procedure: ECHOCARDIOGRAM, TRANSESOPHAGEAL;  Surgeon: Stacia Diannah SQUIBB, MD;  Location: [facility];  Service: Cardiovascular;  Laterality: N/A;   Patient Active Problem List   Diagnosis Date Noted   Abnormal echocardiogram 12/26/2024   Asthma exacerbation 12/23/2024   Acute respiratory failure with hypoxia (HCC) 12/23/2024   Mild persistent asthma without complication 01/15/2022   Generalized anxiety disorder 01/15/2022   Panic attacks 01/15/2022   Essential tremor 01/15/2022   Injury of right foot 01/15/2022   Microscopic hematuria 12/06/2016   Proteinuria 12/06/2016    PCP: Joesph Annabella HERO, FNP  REFERRING PROVIDER: Joesph Annabella HERO, FNP  REFERRING DIAG: [phone] (ICD-10-CM) - History of foot surgery G90.521 (ICD-10-CM) - Complex regional pain syndrome i of right lower limb  THERAPY DIAG:  Muscle weakness (generalized)  Other abnormalities of gait and mobility  Other symptoms and signs involving the nervous system  Pain in right ankle and joints of right foot  Other symptoms and signs involving the musculoskeletal system  Rationale for Evaluation and Treatment: Rehabilitation  ONSET DATE: 3.5 years s/p Right foot crush injury with I&D, pinning of the fourth metatarsal, degloving injury plantar foot    SUBJECTIVE:    SUBJECTIVE STATEMENT: Pt reports 7/10 right ankle pain today.  Pt still has not found a box for his brace  From eval: Just got exosym brace <1 month ago. Did 1 week of quick training with PT. Wanted him to continue to keep using brace. Brace has been helping with his step and his pain but still gets pain in the arch. Uses lofstrand crutches when not using brace but otherwise does use w/c in the house. Uses hiking poles with the brace -- feels wobbly without any a/d. Has gone to [facility] in [place] and tried dry needling, and TENS initially for the CRPS. Reports he does have some ankle movement and pain mostly in the medial foot. Barely able to move toes. Did get email attachment from [facility] about the brace.   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 Yes  PRECAUTIONS: Fall  RED FLAGS: None   WEIGHT BEARING RESTRICTIONS: No  FALLS:  Has patient fallen in last 6 months? No  LIVING ENVIRONMENT: Lives with: lives with their spouse Lives in: House/apartment Stairs: 5-6 steps to get in the house but rest of the house is one level Has following equipment at home: Crutches, Wheelchair (manual), and hiking poles  OCCUPATION: Not working currently  PLOF: Independent with basic ADLs  PATIENT GOALS: Improve movement, return to recreational activities, improve strength, less difficulty with home activities  NEXT MD VISIT: in 6 weeks  OBJECTIVE:  Note: Objective measures were completed at Evaluation unless otherwise noted.  DIAGNOSTIC FINDINGS: Nothing recent  PATIENT SURVEYS:  LEFS  Extreme difficulty/unable (0), Quite a bit of difficulty (1), Moderate difficulty (2), Little difficulty (3), No difficulty (4) Survey date:  12/11/24  Any of your usual work, housework or school activities 1  2. Usual hobbies, recreational or sporting activities 1  3. Getting into/out of the bath 2  4. Walking between rooms 1  5.  Putting on socks/shoes 2  6. Squatting  1  7. Lifting an object, like a bag of groceries from the floor 1  8. Performing light activities around your home 1  9. Performing heavy activities around your home 0  10. Getting into/out of a car 1  11. Walking 2 blocks 0  12. Walking 1 mile 0  13. Going up/down 10 stairs (1 flight) 0  14. Standing for 1 hour 0  15.  sitting for 1 hour 4  16. Running on even ground 0  17. Running on uneven ground 0  18. Making sharp turns while running fast 0  19. Hopping  0  20. Rolling over in bed 4  Score total:  19     COGNITION: Overall cognitive status: Within functional limits for tasks assessed     SENSATION: Impaired -- sensitive to all pressure with increased pain  EDEMA:  None noted today  MUSCLE LENGTH: Did not assess  POSTURE: No Significant postural limitations  PALPATION: Did not test  LOWER EXTREMITY ROM: Limited R foot/ankle movement   LOWER EXTREMITY MMT:  MMT Right eval Left eval  Hip flexion 3+ 4  Hip extension 3 4-  Hip abduction 3 4-  Hip adduction    Hip internal rotation 4- 5  Hip external rotation 3+ 5  Knee flexion 5 5  Knee extension 4- 5  Ankle dorsiflexion    Ankle plantarflexion    Ankle inversion    Ankle eversion     (Blank rows = not tested)  LOWER EXTREMITY SPECIAL TESTS:  Did not assess  FUNCTIONAL TESTS:  Unable to perform tandem stance 1/2 tandem stance: 16.01 sec 30 sec sit to stand: 8 mostly towards L LE TUG: 15.3 sec, 14.43 sec, 14.44 sec; using UEs to push off chair and 2 hiking poles  GAIT: Distance walked: Into clinic Assistive device utilized: 2 hiking poles and exosym brace donned on L Level of assistance: Modified independence Comments: Antalgic, diminished heel strike and knee extension -- tends to amb with more a toe off pattern on R                                                                                                                                TREATMENT  DATE:   01/31/25  EXERCISE LOG  Exercise Repetitions and Resistance Comments  Nustep Lvl 4 x 15 mins   Weight Shifts    LAQs 5# x 25 reps bil   Seated Marches 5# x 25 reps bil   Seated Hip Abduction Green x 30 reps    Seated Hip Adduction 3 mins   Seated Ham Curls Green x 30 reps bil   STS 10 reps concentration for weight bearing    Blank cell = exercise not performed today   01/22/25 Supine bridge, feet on pball, knees extended 2x10x5 Supine single leg bridge knees extended 10x5 Supine bridge feet on pball knees bent 2x10 Tall kneeling quad lean backs 2x10 Tall kneeling into forward planks using pball 2x10 R knee on plinth, 1 UE performing hip hike x10, no UE support x10 Against wall Awilda morgan/glute med setting 2x5 (limited due to pain)   01/16/25                                  EXERCISE LOG  Exercise Repetitions and Resistance Comments  Nustep Lvl 4 x 15 mins   Weight Shifts 3 mins   LAQs 4# x 25 reps bil   Seated Marches 4# x 25 reps bil   Seated Hip Abduction Green x 30 reps    Seated Hip Adduction 3 mins   Seated Ham Curls Green x 30 reps bil    Blank cell = exercise not performed today     PATIENT EDUCATION:  Education details: Exam findings, POC Person educated: Patient Education method: Medical Illustrator Education comprehension: verbalized understanding, returned demonstration, and needs further education  HOME EXERCISE PROGRAM: Access Code: [email or web address] URL: [email or web address] Date: 12/17/2024 Prepared by: Gellen April Earnie Starring  Exercises - Supine Bridge  - 1 x daily - 7 x weekly - 2 sets - 10 reps - Isometric Dead Bug  - 1 x daily - 7 x weekly - 10 reps - 5 sec hold - Beginner Side Leg Lift  - 1 x daily - 7 x weekly - 2 sets - 10 reps - Beginner Prone Single Leg Raise  - 1 x daily - 7 x weekly - 2 sets - 10 reps  ASSESSMENT:  CLINICAL IMPRESSION: Pt arrives for today's treatment  session reporting 7/10 right foot pain.  Pt able to tolerate increased reps with previously performed exercises.  Pt requiring fewer cues for equal weight through LE during STS transfers.  Pt encouraged to find box for brace.  Pt reported decreased pain at completion of today's treatment session.   OBJECTIVE IMPAIRMENTS: Abnormal gait, decreased activity tolerance, decreased balance, decreased coordination, decreased endurance, decreased mobility, difficulty walking, decreased ROM, decreased strength, increased fascial restrictions, impaired sensation, improper body mechanics, postural dysfunction, and pain.   ACTIVITY LIMITATIONS: carrying, lifting, bending, standing, squatting, stairs, transfers, locomotion level, and caring for others  PARTICIPATION LIMITATIONS: cleaning, laundry, shopping, community activity, and yard work  PERSONAL FACTORS: Age, Fitness, Past/current experiences, and Time since onset of injury/illness/exacerbation are also affecting patient's functional outcome.   REHAB POTENTIAL: Good  CLINICAL DECISION MAKING: Evolving/moderate complexity  EVALUATION COMPLEXITY: Moderate   GOALS: Goals reviewed with patient? Yes  SHORT TERM GOALS: Target date: 01/08/2025  Pt will be ind with initial HEP Baseline: Goal status: IN PROGRESS  2.  Pt will be able to perform tandem stance x 10 sec to demo increasing R LE weightbearing and stability Baseline:  Goal status: IN PROGRESS  3.  Pt will be able to amb with only one walking aid for stability x 400' for home distances and normal reciprocal pattern Baseline:  Goal status: IN PROGRESS  4.  Pt will be able to perform at least 15 sit to stands in 30 sec to demo increasing functional LE strength and endurance Baseline: Goal status: IN PROGRESS    LONG TERM GOALS: Target date: 02/05/2025   Pt will be ind with management and progression of HEP Baseline:  Goal status: IN PROGRESS  2.  Pt will be able to amb independently  x1000' with LRAD Baseline:  Goal status: IN PROGRESS  3.  Pt will have improved 30 sec sit to stand to at least 16 to be within his age norms (age norm of 36.2 with a 6.3 standard deviation) Baseline:  Goal status: IN PROGRESS  4.  Pt will have improved TUG to </=10 sec with LRAD Baseline: 14.43 sec Goal status: IN PROGRESS  5.  Pt will have improved tandem stance to at least 20 sec to demo increased standing stability and balance Baseline:  Goal status: IN PROGRESS  6.  Pt will have improved LEFS to >/=31 Baseline:  Goal status: IN PROGRESS   PLAN:  PT FREQUENCY: 18 visits per Worker's Comp  PT DURATION: 6-8 weeks  PLANNED INTERVENTIONS: [id number]- PT Re-evaluation, 97750- Physical Performance Testing, 97110-Therapeutic exercises, 97530- Therapeutic activity, [id number]- Neuromuscular re-education, 97535- Self Care, [id number]- Manual therapy, [id number]- Gait training, [id number]- Orthotic Initial, [phone]- Orthotic/Prosthetic subsequent, [id number]- Electrical stimulation (unattended), [id number]- Ultrasound, 79439 (1-2 muscles), [id number] (3+ muscles)- Dry Needling, Patient/Family education, Balance training, Stair training, Taping, Joint mobilization, Cryotherapy, and Moist heat  PLAN FOR NEXT SESSION: Initiate core, hip and knee strengthening. Work on standing postural stability and weight bearing. Reduce foot/ankle motion as this is counterintuitive to the use of exosym. Gait training, wean to 1 a/d with amb as able.    Delon DELENA Gosling, PTA 01/31/2025, 3:40 PM  "

## 2025-02-04 ENCOUNTER — Ambulatory Visit: Payer: Self-pay

## 2025-02-06 ENCOUNTER — Ambulatory Visit: Payer: Self-pay

## 2025-03-25 ENCOUNTER — Encounter: Payer: Self-pay | Admitting: Family Medicine

## 2025-05-01 ENCOUNTER — Encounter

## 2025-05-01 ENCOUNTER — Ambulatory Visit: Admitting: Pulmonary Disease
